# Patient Record
Sex: Male | Born: 1950 | Race: White | Hispanic: No | Marital: Single | Smoking: Never smoker
Health system: Southern US, Community
[De-identification: ages and names within clinical notes are randomized; demographics above are authoritative.]

## PROBLEM LIST (undated history)

## (undated) DIAGNOSIS — R5381 Other malaise: Secondary | ICD-10-CM

## (undated) DIAGNOSIS — R5383 Other fatigue: Secondary | ICD-10-CM

## (undated) DIAGNOSIS — G4733 Obstructive sleep apnea (adult) (pediatric): Secondary | ICD-10-CM

## (undated) DIAGNOSIS — Z9989 Dependence on other enabling machines and devices: Secondary | ICD-10-CM

## (undated) DIAGNOSIS — I4891 Unspecified atrial fibrillation: Secondary | ICD-10-CM

## (undated) DIAGNOSIS — R600 Localized edema: Secondary | ICD-10-CM

## (undated) DIAGNOSIS — K352 Acute appendicitis with generalized peritonitis, without abscess: Secondary | ICD-10-CM

## (undated) DIAGNOSIS — M7542 Impingement syndrome of left shoulder: Secondary | ICD-10-CM

## (undated) DIAGNOSIS — I10 Essential (primary) hypertension: Secondary | ICD-10-CM

## (undated) DIAGNOSIS — A6000 Herpesviral infection of urogenital system, unspecified: Secondary | ICD-10-CM

## (undated) DIAGNOSIS — E782 Mixed hyperlipidemia: Secondary | ICD-10-CM

## (undated) DIAGNOSIS — K3580 Unspecified acute appendicitis: Secondary | ICD-10-CM

## (undated) DIAGNOSIS — N400 Enlarged prostate without lower urinary tract symptoms: Secondary | ICD-10-CM

## (undated) DIAGNOSIS — E781 Pure hyperglyceridemia: Secondary | ICD-10-CM

## (undated) DIAGNOSIS — R7303 Prediabetes: Secondary | ICD-10-CM

## (undated) DIAGNOSIS — J301 Allergic rhinitis due to pollen: Secondary | ICD-10-CM

## (undated) HISTORY — PX: HERNIA REPAIR: SHX51

## (undated) HISTORY — DX: Impingement syndrome of left shoulder: M75.42

## (undated) HISTORY — DX: Prediabetes: R73.03

## (undated) HISTORY — DX: Other malaise: R53.81

## (undated) HISTORY — DX: Unspecified atrial fibrillation: I48.91

## (undated) HISTORY — DX: Mixed hyperlipidemia: E78.2

## (undated) HISTORY — DX: Unspecified acute appendicitis: K35.80

## (undated) HISTORY — DX: Essential (primary) hypertension: I10

## (undated) HISTORY — DX: Pure hyperglyceridemia: E78.1

## (undated) HISTORY — DX: Herpesviral infection of urogenital system, unspecified: A60.00

## (undated) HISTORY — DX: Other fatigue: R53.83

## (undated) HISTORY — DX: Acute appendicitis with generalized peritonitis, without abscess: K35.20

## (undated) HISTORY — DX: Obstructive sleep apnea (adult) (pediatric): G47.33

## (undated) HISTORY — DX: Benign prostatic hyperplasia without lower urinary tract symptoms: N40.0

## (undated) HISTORY — DX: Allergic rhinitis due to pollen: J30.1

## (undated) HISTORY — DX: Dependence on other enabling machines and devices: Z99.89

## (undated) HISTORY — DX: Localized edema: R60.0

---

## 2012-04-09 ENCOUNTER — Ambulatory Visit: Payer: Self-pay | Admitting: Gastroenterology

## 2012-04-09 LAB — PROTIME-INR: INR: 1

## 2012-04-09 LAB — CBC
HCT: 45.5 % (ref 40.0–52.0)
MCH: 31.9 pg (ref 26.0–34.0)
MCV: 90 fL (ref 80–100)
RBC: 5.04 10*6/uL (ref 4.40–5.90)
RDW: 13.3 % (ref 11.5–14.5)
WBC: 11.7 10*3/uL — ABNORMAL HIGH (ref 3.8–10.6)

## 2012-04-09 LAB — BASIC METABOLIC PANEL
Calcium, Total: 9.2 mg/dL (ref 8.5–10.1)
Chloride: 104 mmol/L (ref 98–107)
Co2: 27 mmol/L (ref 21–32)
Creatinine: 1.1 mg/dL (ref 0.60–1.30)
EGFR (African American): >60
Potassium: 3.6 mmol/L (ref 3.5–5.1)
Sodium: 139 mmol/L (ref 136–145)

## 2012-04-12 LAB — PATHOLOGY REPORT

## 2013-11-09 DIAGNOSIS — R5383 Other fatigue: Secondary | ICD-10-CM

## 2013-11-09 DIAGNOSIS — E782 Mixed hyperlipidemia: Secondary | ICD-10-CM | POA: Insufficient documentation

## 2013-11-09 DIAGNOSIS — R5381 Other malaise: Secondary | ICD-10-CM

## 2013-11-09 DIAGNOSIS — I10 Essential (primary) hypertension: Secondary | ICD-10-CM | POA: Insufficient documentation

## 2013-11-09 DIAGNOSIS — G4733 Obstructive sleep apnea (adult) (pediatric): Secondary | ICD-10-CM

## 2013-11-09 DIAGNOSIS — I4891 Unspecified atrial fibrillation: Secondary | ICD-10-CM

## 2013-11-09 DIAGNOSIS — A6000 Herpesviral infection of urogenital system, unspecified: Secondary | ICD-10-CM | POA: Insufficient documentation

## 2013-11-09 DIAGNOSIS — Z9989 Dependence on other enabling machines and devices: Secondary | ICD-10-CM

## 2013-11-09 HISTORY — DX: Herpesviral infection of urogenital system, unspecified: A60.00

## 2013-11-09 HISTORY — DX: Unspecified atrial fibrillation: I48.91

## 2013-11-09 HISTORY — DX: Dependence on other enabling machines and devices: Z99.89

## 2013-11-09 HISTORY — DX: Other fatigue: R53.83

## 2013-11-09 HISTORY — DX: Obstructive sleep apnea (adult) (pediatric): G47.33

## 2013-11-09 HISTORY — DX: Essential (primary) hypertension: I10

## 2013-11-09 HISTORY — DX: Other malaise: R53.81

## 2013-11-09 HISTORY — DX: Mixed hyperlipidemia: E78.2

## 2014-01-27 DIAGNOSIS — N400 Enlarged prostate without lower urinary tract symptoms: Secondary | ICD-10-CM | POA: Insufficient documentation

## 2014-01-27 DIAGNOSIS — E781 Pure hyperglyceridemia: Secondary | ICD-10-CM

## 2014-01-27 HISTORY — DX: Benign prostatic hyperplasia without lower urinary tract symptoms: N40.0

## 2014-01-27 HISTORY — DX: Pure hyperglyceridemia: E78.1

## 2014-10-29 NOTE — Consult Note (Signed)
PATIENT NAME:  Jonathan Chung, VERGA MR#:  [id number] DATE OF BIRTH:  1950/09/06  DATE OF CONSULTATION:  04/09/2012  REFERRING PHYSICIAN:  Liana Gerold, MD  CONSULTING PHYSICIAN:  Lollie Sails, MD  REASON FOR CONSULTATION: Esophageal foreign body/food impaction.   HISTORY OF PRESENT ILLNESS: Jonathan Chung is a very pleasant 64 year old Caucasian male who came to the Emergency Room last night with problem of difficulty swallowing. He states that he was eating some chicken tenders around noon. One of these seemed to get stuck. He said he threw up several times, not induced but rather on its own, with no decrease in the symptoms. Since that time he has not been handling secretions. Multiple interventions have been undertaken in the Emergency Room without success. The patient states that he has had some occasional food hanging that may last for 5 or 10 seconds for about a year intermittently. He has never had something hung to this degree. He has never had an upper scope in the past. He apparently, however, in discussion with him though has had two colonoscopies. On the last one a little over a year or so ago his physician also wanted to do an upper scope but for some reason that was not done. I believe he was having significant symptoms at that time. He has no history of colon polyps. He does have a history of a hiatal hernia with occasional reflux and he takes Mylanta occasionally. He has tried proton pump inhibitor for a short period but I do not believe he has ever taken it regularly. He does take a 325 mg aspirin daily. He has no history of peptic ulcer disease.   GI FAMILY HISTORY: Maternal aunt with colon cancer. Negative for liver disease. Father with peptic ulcer disease.   PAST MEDICAL HISTORY:  1. History of atrial fibrillation. 2. Hypertension.  3. He denies any hospitalizations or surgeries.  4. History of herpes for which he takes a suppressive dose of Valacyclovir.   ALLERGIES: He has  allergy to Cipro.   CURRENT MEDICATIONS:  1. Aspirin 325 mg. 2. Hydrochlorothiazide 25 mg a day. 3. Levothyroxine 50 mcg a day. 4. Metoprolol 5 mg twice a day. 5. Simvastatin 20 mg once a day.  6. Valacyclovir 500 mg once a day.   PHYSICAL EXAMINATION:   VITAL SIGNS: Temperature 97.7, pulse 98, respirations 20, blood pressure 119/87, pulse oximetry 95%.   GENERAL: He is a well appearing 64 year old Caucasian male in no acute distress.   HEENT: Normocephalic, atraumatic.   EYES: Anicteric.   NOSE: Septum midline.   OROPHARYNX: No lesions. Good dentition.   NECK: Supple. No JVD. No lymphadenopathy. No thyromegaly.   HEART: Irregular rate and rhythm.   LUNGS: Bilaterally clear.   ABDOMEN: Soft, nontender, nondistended. Bowel sounds positive, normoactive.   RECTAL: Anorectal exam is deferred.   EXTREMITIES: No clubbing, cyanosis, or edema.   NEUROLOGICAL: Cranial nerves II through XII grossly intact. Muscle strength bilaterally equal and symmetric. Deep tendon reflexes bilaterally equal and symmetric.   LABORATORY, DIAGNOSTIC, AND RADIOLOGICAL DATA: Glucose 109, BUN 22, creatinine 1.10, sodium 139, potassium 3.6, chloride 104, bicarb 27, osmolality 281, calcium 9.2. Hemogram shows a white count of 11.7, hemoglobin and hematocrit 16.1/45.5, platelet count 211, MCV 90. Pro-time 13.3. INR 1.0. There have been no imaging studies.   ASSESSMENT:  1. Esophageal food impaction.  2. Previous history of intermittent dysphagia, not on a proton pump inhibitor.  3. Atrial fibrillation as noted above.   RECOMMENDATION: EGD with foreign  body removal with anesthesia assistance. I have discussed the risks, benefits, and complications of EGD to include, but not limited to, bleeding, infection, perforation, and the risk of sedation and he wishes to proceed.   ____________________________ Lollie Sails, MD mus:drc D: 04/09/2012 10:09:34 ET T: 04/09/2012 10:31:56  ET JOB#: [id number]  cc: Lollie Sails, MD, <Dictator> Lollie Sails MD ELECTRONICALLY SIGNED 05/11/2012 11:19

## 2014-10-29 NOTE — Consult Note (Signed)
Brief Consult Note: Diagnosis: esophageal foriegn body/food impaction.   Patient was seen by consultant.   Consult note dictated.   Recommend to proceed with surgery or procedure.   Discussed with Attending MD.   Comments: Patietn seen and examined, chart reviewed. Please see full GI consult [phone].  Patient comes to ER with esophageal food impaction.  Will arrange egd with anesthesia assistance.  Electronic Signatures: Loistine Simas (MD)  (Signed 29-Sep-13 10:26)  Authored: Brief Consult Note   Last Updated: 29-Sep-13 10:26 by Loistine Simas (MD)

## 2014-11-18 DIAGNOSIS — R7303 Prediabetes: Secondary | ICD-10-CM

## 2014-11-18 HISTORY — DX: Prediabetes: R73.03

## 2014-11-25 DIAGNOSIS — D239 Other benign neoplasm of skin, unspecified: Secondary | ICD-10-CM | POA: Insufficient documentation

## 2015-08-15 DIAGNOSIS — J301 Allergic rhinitis due to pollen: Secondary | ICD-10-CM | POA: Insufficient documentation

## 2015-08-15 HISTORY — DX: Allergic rhinitis due to pollen: J30.1

## 2016-08-16 DIAGNOSIS — M7542 Impingement syndrome of left shoulder: Secondary | ICD-10-CM | POA: Insufficient documentation

## 2016-08-16 DIAGNOSIS — R600 Localized edema: Secondary | ICD-10-CM | POA: Insufficient documentation

## 2016-08-16 HISTORY — DX: Localized edema: R60.0

## 2016-08-16 HISTORY — DX: Impingement syndrome of left shoulder: M75.42

## 2016-09-06 ENCOUNTER — Emergency Department: Payer: Medicare HMO | Admitting: Anesthesiology

## 2016-09-06 ENCOUNTER — Emergency Department: Payer: Medicare HMO

## 2016-09-06 ENCOUNTER — Inpatient Hospital Stay
Admission: EM | Admit: 2016-09-06 | Discharge: 2016-09-09 | DRG: 340 | Disposition: A | Discharge status: Home Health | Payer: Medicare HMO | Attending: Surgery | Admitting: Surgery

## 2016-09-06 ENCOUNTER — Encounter
Admission: EM | Disposition: A | Discharge status: Home Health | Payer: Self-pay | Source: Home / Self Care | Attending: Surgery

## 2016-09-06 DIAGNOSIS — Z7982 Long term (current) use of aspirin: Secondary | ICD-10-CM

## 2016-09-06 DIAGNOSIS — I10 Essential (primary) hypertension: Secondary | ICD-10-CM | POA: Diagnosis present

## 2016-09-06 DIAGNOSIS — Z6833 Body mass index (BMI) 33.0-33.9, adult: Secondary | ICD-10-CM

## 2016-09-06 DIAGNOSIS — K3580 Unspecified acute appendicitis: Secondary | ICD-10-CM | POA: Diagnosis present

## 2016-09-06 DIAGNOSIS — Z79899 Other long term (current) drug therapy: Secondary | ICD-10-CM

## 2016-09-06 DIAGNOSIS — E669 Obesity, unspecified: Secondary | ICD-10-CM | POA: Diagnosis present

## 2016-09-06 DIAGNOSIS — K352 Acute appendicitis with generalized peritonitis, without abscess: Secondary | ICD-10-CM

## 2016-09-06 DIAGNOSIS — I4891 Unspecified atrial fibrillation: Secondary | ICD-10-CM | POA: Diagnosis present

## 2016-09-06 DIAGNOSIS — G4730 Sleep apnea, unspecified: Secondary | ICD-10-CM | POA: Diagnosis present

## 2016-09-06 DIAGNOSIS — Z888 Allergy status to other drugs, medicaments and biological substances status: Secondary | ICD-10-CM

## 2016-09-06 HISTORY — PX: LAPAROSCOPIC APPENDECTOMY: SHX408

## 2016-09-06 HISTORY — DX: Unspecified atrial fibrillation: I48.91

## 2016-09-06 HISTORY — DX: Essential (primary) hypertension: I10

## 2016-09-06 LAB — CBC
HEMATOCRIT: 43.2 % (ref 40.0–52.0)
HEMOGLOBIN: 14.6 g/dL (ref 13.0–18.0)
MCH: 31.1 pg (ref 26.0–34.0)
MCHC: 33.7 g/dL (ref 32.0–36.0)
MCV: 92.2 fL (ref 80.0–100.0)
Platelets: 178 10*3/uL (ref 150–440)
RBC: 4.69 MIL/uL (ref 4.40–5.90)
RDW: 13.6 % (ref 11.5–14.5)
WBC: 11.5 10*3/uL — ABNORMAL HIGH (ref 3.8–10.6)

## 2016-09-06 LAB — URINALYSIS, COMPLETE (UACMP) WITH MICROSCOPIC
BACTERIA UA: NONE SEEN
Bilirubin Urine: NEGATIVE
Glucose, UA: NEGATIVE mg/dL
Hgb urine dipstick: NEGATIVE
Ketones, ur: 5 mg/dL — AB
Leukocytes, UA: NEGATIVE
Nitrite: NEGATIVE
PROTEIN: NEGATIVE mg/dL
RBC / HPF: NONE SEEN RBC/hpf (ref 0–5)
SPECIFIC GRAVITY, URINE: 1.017 (ref 1.005–1.030)
SQUAMOUS EPITHELIAL / LPF: NONE SEEN
pH: 5 (ref 5.0–8.0)

## 2016-09-06 LAB — COMPREHENSIVE METABOLIC PANEL
ALBUMIN: 4.2 g/dL (ref 3.5–5.0)
ALK PHOS: 58 U/L (ref 38–126)
ALT: 19 U/L (ref 17–63)
ANION GAP: 8 (ref 5–15)
AST: 19 U/L (ref 15–41)
BILIRUBIN TOTAL: 0.8 mg/dL (ref 0.3–1.2)
BUN: 19 mg/dL (ref 6–20)
CO2: 25 mmol/L (ref 22–32)
Calcium: 9.1 mg/dL (ref 8.9–10.3)
Chloride: 105 mmol/L (ref 101–111)
Creatinine, Ser: 1.02 mg/dL (ref 0.61–1.24)
GFR calc Af Amer: >60 mL/min (ref >60)
GFR calc non Af Amer: >60 mL/min (ref >60)
GLUCOSE: 112 mg/dL — AB (ref 65–99)
POTASSIUM: 4 mmol/L (ref 3.5–5.1)
SODIUM: 138 mmol/L (ref 135–145)
TOTAL PROTEIN: 7.5 g/dL (ref 6.5–8.1)

## 2016-09-06 LAB — LIPASE, BLOOD: Lipase: 24 U/L (ref 11–51)

## 2016-09-06 SURGERY — APPENDECTOMY, LAPAROSCOPIC
Anesthesia: General

## 2016-09-06 MED ORDER — PROPOFOL 10 MG/ML IV BOLUS
INTRAVENOUS | Status: AC
Start: 2016-09-06 — End: 2016-09-06
  Filled 2016-09-06: qty 20

## 2016-09-06 MED ORDER — FENTANYL CITRATE (PF) 100 MCG/2ML IJ SOLN
INTRAMUSCULAR | Status: AC
  Filled 2016-09-06: qty 2

## 2016-09-06 MED ORDER — ONDANSETRON HCL 4 MG/2ML IJ SOLN
INTRAMUSCULAR | Status: AC
  Filled 2016-09-06: qty 2

## 2016-09-06 MED ORDER — PROPOFOL 10 MG/ML IV BOLUS
INTRAVENOUS | Status: DC | PRN
  Administered 2016-09-06: 160 mg via INTRAVENOUS

## 2016-09-06 MED ORDER — LIDOCAINE HCL (PF) 2 % IJ SOLN
INTRAMUSCULAR | Status: AC
  Filled 2016-09-06: qty 2

## 2016-09-06 MED ORDER — SUCCINYLCHOLINE CHLORIDE 20 MG/ML IJ SOLN
INTRAMUSCULAR | Status: AC
  Filled 2016-09-06: qty 1

## 2016-09-06 MED ORDER — LACTATED RINGERS IV SOLN
INTRAVENOUS | Status: DC | PRN
  Administered 2016-09-06 – 2016-09-07 (×2): via INTRAVENOUS

## 2016-09-06 MED ORDER — SUCCINYLCHOLINE CHLORIDE 20 MG/ML IJ SOLN
INTRAMUSCULAR | Status: DC | PRN
  Administered 2016-09-06: 100 mg via INTRAVENOUS

## 2016-09-06 MED ORDER — IOPAMIDOL (ISOVUE-300) INJECTION 61%
100.0000 mL | Freq: Once | INTRAVENOUS | Status: AC | PRN
  Administered 2016-09-06: 100 mL via INTRAVENOUS

## 2016-09-06 MED ORDER — SODIUM CHLORIDE 0.9 % IV BOLUS (SEPSIS)
1000.0000 mL | INTRAVENOUS | Status: AC
  Administered 2016-09-06: 1000 mL via INTRAVENOUS

## 2016-09-06 MED ORDER — MIDAZOLAM HCL 2 MG/2ML IJ SOLN
INTRAMUSCULAR | Status: DC | PRN
  Administered 2016-09-06: 2 mg via INTRAVENOUS

## 2016-09-06 MED ORDER — ESMOLOL HCL 100 MG/10ML IV SOLN
INTRAVENOUS | Status: AC
  Filled 2016-09-06: qty 10

## 2016-09-06 MED ORDER — FENTANYL CITRATE (PF) 100 MCG/2ML IJ SOLN
INTRAMUSCULAR | Status: DC | PRN
  Administered 2016-09-06 (×4): 50 ug via INTRAVENOUS

## 2016-09-06 MED ORDER — LIDOCAINE HCL (CARDIAC) 20 MG/ML IV SOLN
INTRAVENOUS | Status: DC | PRN
  Administered 2016-09-06: 100 mg via INTRAVENOUS

## 2016-09-06 MED ORDER — MIDAZOLAM HCL 2 MG/2ML IJ SOLN
INTRAMUSCULAR | Status: AC
  Filled 2016-09-06: qty 2

## 2016-09-06 MED ORDER — MORPHINE SULFATE (PF) 4 MG/ML IV SOLN
4.0000 mg | Freq: Once | INTRAVENOUS | Status: AC
  Administered 2016-09-06: 4 mg via INTRAVENOUS
  Filled 2016-09-06: qty 1

## 2016-09-06 MED ORDER — MORPHINE SULFATE (PF) 2 MG/ML IV SOLN
2.0000 mg | INTRAVENOUS | Status: DC | PRN
  Administered 2016-09-06: 2 mg via INTRAVENOUS
  Filled 2016-09-06: qty 1

## 2016-09-06 MED ORDER — ACETAMINOPHEN 10 MG/ML IV SOLN
INTRAVENOUS | Status: AC
  Filled 2016-09-06: qty 100

## 2016-09-06 MED ORDER — IOPAMIDOL (ISOVUE-300) INJECTION 61%
30.0000 mL | Freq: Once | INTRAVENOUS | Status: AC | PRN
  Administered 2016-09-06: 30 mL via ORAL

## 2016-09-06 MED ORDER — PIPERACILLIN-TAZOBACTAM 3.375 G IVPB
3.3750 g | Freq: Three times a day (TID) | INTRAVENOUS | Status: DC
  Administered 2016-09-06: 3.375 g via INTRAVENOUS
  Filled 2016-09-06 (×4): qty 50

## 2016-09-06 MED ORDER — ONDANSETRON HCL 4 MG/2ML IJ SOLN
4.0000 mg | INTRAMUSCULAR | Status: AC
  Administered 2016-09-06: 4 mg via INTRAVENOUS
  Filled 2016-09-06: qty 2

## 2016-09-06 SURGICAL SUPPLY — 43 items
APPLICATOR COTTON TIP 6IN STRL (MISCELLANEOUS) ×3 IMPLANT
APPLIER CLIP 5 13 M/L LIGAMAX5 (MISCELLANEOUS)
BLADE CLIPPER SURG (BLADE) ×3 IMPLANT
CANISTER SUCT 1200ML W/VALVE (MISCELLANEOUS) IMPLANT
CANISTER SUCT 3000ML (MISCELLANEOUS) ×3 IMPLANT
CHLORAPREP W/TINT 26ML (MISCELLANEOUS) ×3 IMPLANT
CLIP APPLIE 5 13 M/L LIGAMAX5 (MISCELLANEOUS) IMPLANT
CUTTER FLEX LINEAR 45M (STAPLE) ×3 IMPLANT
DERMABOND ADVANCED (GAUZE/BANDAGES/DRESSINGS) ×2
DERMABOND ADVANCED .7 DNX12 (GAUZE/BANDAGES/DRESSINGS) ×1 IMPLANT
DRAIN CHANNEL JP 19F (MISCELLANEOUS) ×3 IMPLANT
ELECT CAUTERY BLADE 6.4 (BLADE) ×3 IMPLANT
ELECT REM PT RETURN 9FT ADLT (ELECTROSURGICAL) ×3
ELECTRODE REM PT RTRN 9FT ADLT (ELECTROSURGICAL) ×1 IMPLANT
ENDOPOUCH RETRIEVER 10 (MISCELLANEOUS) ×3 IMPLANT
GAUZE SPONGE 4X4 12PLY STRL (GAUZE/BANDAGES/DRESSINGS) ×6 IMPLANT
GLOVE BIO SURGEON STRL SZ7 (GLOVE) ×6 IMPLANT
GOWN STRL REUS W/ TWL LRG LVL3 (GOWN DISPOSABLE) ×2 IMPLANT
GOWN STRL REUS W/TWL LRG LVL3 (GOWN DISPOSABLE) ×4
IRRIGATION STRYKERFLOW (MISCELLANEOUS) ×1 IMPLANT
IRRIGATOR STRYKERFLOW (MISCELLANEOUS) ×3
IV NS 1000ML (IV SOLUTION) ×4
IV NS 1000ML BAXH (IV SOLUTION) ×2 IMPLANT
NEEDLE HYPO 22GX1.5 SAFETY (NEEDLE) ×3 IMPLANT
NS IRRIG 500ML POUR BTL (IV SOLUTION) ×3 IMPLANT
PACK LAP CHOLECYSTECTOMY (MISCELLANEOUS) ×3 IMPLANT
PENCIL ELECTRO HAND CTR (MISCELLANEOUS) ×3 IMPLANT
RELOAD 45 VASCULAR/THIN (ENDOMECHANICALS) IMPLANT
RELOAD STAPLE TA45 3.5 REG BLU (ENDOMECHANICALS) ×6 IMPLANT
SCALPEL HARMONIC ACE (MISCELLANEOUS) ×3 IMPLANT
SCISSORS METZENBAUM CVD 33 (INSTRUMENTS) IMPLANT
SLEEVE ENDOPATH XCEL 5M (ENDOMECHANICALS) ×3 IMPLANT
SLEEVE SCD COMPRESS THIGH MED (MISCELLANEOUS) ×3 IMPLANT
SPONGE DRAIN TRACH 4X4 STRL 2S (GAUZE/BANDAGES/DRESSINGS) ×3 IMPLANT
SPONGE LAP 18X18 5 PK (GAUZE/BANDAGES/DRESSINGS) ×3 IMPLANT
SUT ETHILON 3 0 PS 1 (SUTURE) ×3 IMPLANT
SUT MNCRL AB 4-0 PS2 18 (SUTURE) ×3 IMPLANT
SUT VICRYL 0 AB UR-6 (SUTURE) ×6 IMPLANT
SYR 20CC LL (SYRINGE) ×3 IMPLANT
TRAY FOLEY W/METER SILVER 16FR (SET/KITS/TRAYS/PACK) IMPLANT
TROCAR XCEL BLUNT TIP 100MML (ENDOMECHANICALS) ×3 IMPLANT
TROCAR XCEL NON-BLD 5MMX100MML (ENDOMECHANICALS) ×6 IMPLANT
TUBING INSUF HEATED (TUBING) ×3 IMPLANT

## 2016-09-06 NOTE — ED Notes (Signed)
CT informed that patient had finished one bottle of contrast and could be scanned per Dr. Karma Greaser

## 2016-09-06 NOTE — H&P (Signed)
Patient ID: Jonathan Chung, male   DOB: September 21, 1950, 66 y.o.   MRN: [id number]  HPI Jonathan Chung is a 66 y.o. male that 2 days history of abdominal pain that is started Saturday night. He reports that the pain is intermittent but today has been constant. Pain is moderate to severe intensity located in the right lower quadrant and radiated to the mid abdomen. The associated nausea no vomiting. No fevers no chills. Decreased appetite. He does have a history of A. fib and is only taking aspirin and Cardizem. Part of the workup a CT scan was performed and I have personally reviewed there is evidence of appendiceal wall thickening with some fat stranding and dilation of the appendix consistent with appendicitis. There is no evidence of free air and there is no evidence of abscess or perforation. History of umbilical hernia repair a few years ago with mesh. Mildly elevated WBC, normal creatinine. HE has good CV reserve, able to perform more than 6 METS w/o angina or SOB.   HPI  Past Medical History:  Diagnosis Date  . A-fib ([place])   . Hypertension     Past Surgical History:  Procedure Laterality Date  . HERNIA REPAIR      No family history on file.  Social History Social History  Substance Use Topics  . Smoking status: Never Smoker  . Smokeless tobacco: Never Used  . Alcohol use Yes    Allergies  Allergen Reactions  . Ciprofloxacin Rash    Current Facility-Administered Medications  Medication Dose Route Frequency Provider Last Rate Last Dose  . morphine 2 MG/ML injection 2 mg  2 mg Intravenous Q2H PRN Marcos Peloso F Aashna Matson, MD      . piperacillin-tazobactam (ZOSYN) IVPB 3.375 g  3.375 g Intravenous Q8H Owen Pratte Sarita Haver, MD       Current Outpatient Prescriptions  Medication Sig Dispense Refill  . aspirin 325 MG tablet Take 325 mg by mouth every morning.    . cetirizine (ZYRTEC) 10 MG tablet Take 10 mg by mouth daily.    Marland Kitchen diltiazem (CARDIZEM CD) 180 MG 24 hr capsule Take 180 mg by mouth  daily.    . hydrochlorothiazide (HYDRODIURIL) 25 MG tablet Take 12.5 mg by mouth daily.    Marland Kitchen levothyroxine (SYNTHROID, LEVOTHROID) 50 MCG tablet Take 50 mcg by mouth daily.    Marland Kitchen loratadine (CLARITIN) 10 MG tablet Take 10 mg by mouth daily.    . Multiple Vitamin (THERA) TABS Take 1 tablet by mouth every morning.    . Omega-3 1000 MG CAPS Take 1,000 mg by mouth every morning.    Marland Kitchen omeprazole (PRILOSEC) 20 MG capsule Take 20 mg by mouth daily.    . simvastatin (ZOCOR) 20 MG tablet Take 20 mg by mouth daily.  0  . tamsulosin (FLOMAX) 0.4 MG CAPS capsule Take 0.4 mg by mouth daily.  0  . valACYclovir (VALTREX) 500 MG tablet Take 500 mg by mouth 4 (four) times a week. Take 500 mg on Sunday, Tuesday, Thursday and Saturday.    . vitamin E 400 UNIT capsule Take 400 Units by mouth every morning.       Review of Systems A 10 point review of systems was asked and was negative except for the information on the HPI  Physical Exam Blood pressure 112/84, pulse (!) 115, temperature 99 F (37.2 C), temperature source Oral, resp. rate (!) 28, height 5\' 10"  (1.778 m), weight 99.8 kg (220 lb), SpO2 91 %. CONSTITUTIONAL: NAD EYES: Pupils are  equal, round, and reactive to light, Sclera are non-icteric. EARS, NOSE, MOUTH AND THROAT: The oropharynx is clear. The oral mucosa is pink and moist. Hearing is intact to voice. LYMPH NODES:  Lymph nodes in the neck are normal. RESPIRATORY:  Lungs are clear. There is normal respiratory effort, with equal breath sounds bilaterally, and without pathologic use of accessory muscles. CARDIOVASCULAR: Heart is iregular without murmurs, gallops, or rubs. Tachy GI: The abdomen is  Soft, Previous infraumbilical scar. There is tenderness to palpation on RLQ . No overt peritonitis GU: Rectal deferred.   MUSCULOSKELETAL: Normal muscle strength and tone. No cyanosis or edema.   SKIN: Turgor is good and there are no pathologic skin lesions or ulcers. NEUROLOGIC: Motor and sensation is  grossly normal. Cranial nerves are grossly intact. PSYCH:  Oriented to person, place and time. Affect is normal.  Data Reviewed I have personally reviewed the patient's imaging, laboratory findings and medical records.    Assessment/Plan   Male with classic signs and symptoms of acute appendicitis confirmed by CT scan. Discussed with the patient in detail about his disease process and I do recommend appendectomy. Options of antibiotic management versus surgery were discussed. He agrees to proceed with appendectomy. The risks, benefits, complications, treatment options, and expected outcomes were discussed with the patient. The treatment of antibiotics alone was discussed giving a 20% chance that this could fail and surgery would be necessary.  Also discussed continuing to the operating room for Laparoscopic Appendectomy.  The possibilities of  bleeding, recurrent infection, perforation of viscus, finding a normal appendix, the need for additional procedures, failure to diagnose a condition, conversion to open procedure and creating a complication requiring transfusion or further operations were discussed. The patient was given the opportunity to ask questions and have them answered.  Patient would like to proceed with Laparoscopic Appendectomy and consent was obtained.   Caroleen Hamman, MD FACS General Surgeon 09/06/2016, 8:16 PM

## 2016-09-06 NOTE — Anesthesia Preprocedure Evaluation (Addendum)
Anesthesia Evaluation  Patient identified by MRN, date of birth, ID band Patient awake    Reviewed: Allergy & Precautions, NPO status , Patient's Chart, lab work & pertinent test results  History of Anesthesia Complications Negative for: history of anesthetic complications  Airway Mallampati: II  TM Distance: >3 FB Neck ROM: Full    Dental  (+) Partial Upper   Pulmonary neg pulmonary ROS, sleep apnea (does not use CPAP) , neg COPD,    breath sounds clear to auscultation- rhonchi (-) wheezing      Cardiovascular hypertension, Pt. on medications + dysrhythmias Atrial Fibrillation  Rhythm:Regular Rate:Normal - Systolic murmurs and - Diastolic murmurs    Neuro/Psych negative neurological ROS  negative psych ROS   GI/Hepatic negative GI ROS, Neg liver ROS,   Endo/Other  neg diabetesHypothyroidism   Renal/GU negative Renal ROS     Musculoskeletal negative musculoskeletal ROS (+)   Abdominal (+) + obese,   Peds  Hematology negative hematology ROS (+)   Anesthesia Other Findings Past Medical History: No date: A-fib ([facility]) No date: Hypertension   Reproductive/Obstetrics                             Anesthesia Physical Anesthesia Plan  ASA: III and emergent  Anesthesia Plan: General   Post-op Pain Management:    Induction: Intravenous, Rapid sequence and Cricoid pressure planned  Airway Management Planned: Oral ETT  Additional Equipment:   Intra-op Plan:   Post-operative Plan: Extubation in OR  Informed Consent: I have reviewed the patients History and Physical, chart, labs and discussed the procedure including the risks, benefits and alternatives for the proposed anesthesia with the patient or authorized representative who has indicated his/her understanding and acceptance.   Dental advisory given  Plan Discussed with: CRNA and Anesthesiologist  Anesthesia Plan Comments:         Anesthesia Quick Evaluation

## 2016-09-06 NOTE — ED Triage Notes (Signed)
Pt c/o lower abd pain with nausea since Saturday night. Denies vomiting or diarrhea. States had a BM today after taking a stool softener

## 2016-09-06 NOTE — ED Provider Notes (Signed)
[facility] Emergency Department Provider Note  ____________________________________________   First MD Initiated Contact with Patient 09/06/16 1640     (approximate)  I have reviewed the triage vital signs and the nursing notes.   HISTORY  Chief Complaint Abdominal Pain    HPI Jonathan Chung is a 66 y.o. male with a history of A. fib and hypertension but who only takes a full dose aspirin daily and is not on any other anticoagulation.  He presents for evaluation of a Almyra Free worsening lower abdominal pain with nausea that has been getting worse over the last 3 days.  He states it started and then seemed to get a little bit better but then came back and is now severe.  He had not vomited until coming to the emergency department but he did have persistent vomiting and anorexia.  He denies fever/chills, chest pain, shortness of breath, diarrhea, dysuria.  Movement makes the pain worse and rest makes it a little bit better.  He describes the pain as sharp, aching, and primarily in his lower abdomen across both sides although it radiates to his right flank.  He has no history of kidney stones.   Past Medical History:  Diagnosis Date  . A-fib ([place])   . Hypertension     There are no active problems to display for this patient.   Past Surgical History:  Procedure Laterality Date  . HERNIA REPAIR      Prior to Admission medications   Medication Sig Start Date End Date Taking? Authorizing Provider  aspirin 325 MG tablet Take 325 mg by mouth every morning.   Yes Historical Provider, MD  cetirizine (ZYRTEC) 10 MG tablet Take 10 mg by mouth daily.   Yes Historical Provider, MD  diltiazem (CARDIZEM CD) 180 MG 24 hr capsule Take 180 mg by mouth daily. 09/06/16  Yes Historical Provider, MD  hydrochlorothiazide (HYDRODIURIL) 25 MG tablet Take 12.5 mg by mouth daily. 12/22/15  Yes Historical Provider, MD  levothyroxine (SYNTHROID, LEVOTHROID) 50 MCG tablet Take 50 mcg  by mouth daily. 09/06/16  Yes Historical Provider, MD  loratadine (CLARITIN) 10 MG tablet Take 10 mg by mouth daily.   Yes Historical Provider, MD  Multiple Vitamin (THERA) TABS Take 1 tablet by mouth every morning.   Yes Historical Provider, MD  Omega-3 1000 MG CAPS Take 1,000 mg by mouth every morning.   Yes Historical Provider, MD  omeprazole (PRILOSEC) 20 MG capsule Take 20 mg by mouth daily. 12/15/15  Yes Historical Provider, MD  simvastatin (ZOCOR) 20 MG tablet Take 20 mg by mouth daily. 06/23/16  Yes Historical Provider, MD  tamsulosin (FLOMAX) 0.4 MG CAPS capsule Take 0.4 mg by mouth daily. 08/09/16  Yes Historical Provider, MD  valACYclovir (VALTREX) 500 MG tablet Take 500 mg by mouth 4 (four) times a week. Take 500 mg on Sunday, Tuesday, Thursday and Saturday. 11/21/15  Yes Historical Provider, MD  vitamin E 400 UNIT capsule Take 400 Units by mouth every morning.   Yes Historical Provider, MD    Allergies Ciprofloxacin  No family history on file.  Social History Social History  Substance Use Topics  . Smoking status: Never Smoker  . Smokeless tobacco: Never Used  . Alcohol use Yes    Review of Systems Constitutional: No fever/chills Eyes: No visual changes. ENT: No sore throat. Cardiovascular: Denies chest pain. Respiratory: Denies shortness of breath. Gastrointestinal: Lower abdominal pain (bilateral) that radiates to right flank.  Nausea x 3 days with vomiting  after arriving in ED.  No diarrhea.  No constipation. Genitourinary: Negative for dysuria. Musculoskeletal: Negative for back pain. Skin: Negative for rash. Neurological: Negative for headaches, focal weakness or numbness.  10-point ROS otherwise negative.  ____________________________________________   PHYSICAL EXAM:  VITAL SIGNS: ED Triage Vitals  Enc Vitals Group     BP 09/06/16 1606 (!) 142/75     Pulse Rate 09/06/16 1606 100     Resp 09/06/16 1606 16     Temp 09/06/16 1606 99 F (37.2 C)     Temp  Source 09/06/16 1606 Oral     SpO2 09/06/16 1606 97 %     Weight 09/06/16 1607 220 lb (99.8 kg)     Height 09/06/16 1607 5\' 10"  (1.778 m)     Head Circumference --      Peak Flow --      Pain Score 09/06/16 1610 10     Pain Loc --      Pain Edu? --      Excl. in [place]? --     Constitutional: Alert and oriented. Appears uncomfortable but non-toxic Eyes: Conjunctivae are normal. PERRL. EOMI. Head: Atraumatic. Nose: No congestion/rhinnorhea. Mouth/Throat: Mucous membranes are moist. Neck: No stridor.  No meningeal signs.   Cardiovascular: Normal rate, regular rhythm. Good peripheral circulation. Grossly normal heart sounds. Respiratory: Normal respiratory effort.  No retractions. Lungs CTAB. Gastrointestinal: Soft with generalized tenderness throughout lower abdomen, mild rebound tenderness.  No CVA tenderness Musculoskeletal: No lower extremity tenderness nor edema. No gross deformities of extremities. Neurologic:  Normal speech and language. No gross focal neurologic deficits are appreciated.  Skin:  Skin is warm, dry and intact. No rash noted. Psychiatric: Mood and affect are normal. Speech and behavior are normal.  ____________________________________________   LABS (all labs ordered are listed, but only abnormal results are displayed)  Labs Reviewed  COMPREHENSIVE METABOLIC PANEL - Abnormal; Notable for the following:       Result Value   Glucose, Bld 112 (*)    All other components within normal limits  CBC - Abnormal; Notable for the following:    WBC 11.5 (*)    All other components within normal limits  URINALYSIS, COMPLETE (UACMP) WITH MICROSCOPIC - Abnormal; Notable for the following:    Color, Urine YELLOW (*)    APPearance CLEAR (*)    Ketones, ur 5 (*)    All other components within normal limits  LIPASE, BLOOD   ____________________________________________  EKG  ED ECG REPORT I, Tayonna Bacha, the attending physician, personally viewed and interpreted this  ECG.  Date: 09/06/2016 EKG Time: 19:37 Rate: 108 Rhythm: atrial fibrillation QRS Axis: normal Intervals: a-fib, RBBB and LAFB ST/T Wave abnormalities: Non-specific ST segment / T-wave changes, but no evidence of acute ischemia. Conduction Disturbances: none Narrative Interpretation: unremarkable  ____________________________________________  RADIOLOGY   Ct Abdomen Pelvis W Contrast  Result Date: 09/06/2016 CLINICAL DATA:  Lower abdominal pain with nausea since Saturday EXAM: CT ABDOMEN AND PELVIS WITH CONTRAST TECHNIQUE: Multidetector CT imaging of the abdomen and pelvis was performed using the standard protocol following bolus administration of intravenous contrast. CONTRAST:  163mL ISOVUE-300 IOPAMIDOL (ISOVUE-300) INJECTION 61% COMPARISON:  None. FINDINGS: Lower chest: Trace right pleural effusion. Bilateral lung base atelectasis. Hepatobiliary: No focal liver abnormality is seen. No gallstones, gallbladder wall thickening, or biliary dilatation. Pancreas: Unremarkable. No pancreatic ductal dilatation or surrounding inflammatory changes. Spleen: Normal in size without focal abnormality. Adrenals/Urinary Tract: Adrenal glands are unremarkable. Kidneys are normal, without renal calculi,  focal lesion, or hydronephrosis. Bladder is unremarkable. Stomach/Bowel: Stomach is within normal limits. Dilated appendix measuring 14 mm with appendiceal wall thickening and periappendiceal inflammatory changes consistent with acute appendicitis. No evidence of bowel wall thickening, distention, or inflammatory changes. Vascular/Lymphatic: Normal caliber abdominal aorta with atherosclerosis. No lymphadenopathy. Reproductive: Prostatic enlargement measuring 7.2 x 5.7 cm. Other: No abdominal wall hernia or abnormality. No abdominopelvic ascites. Musculoskeletal: No lytic or sclerotic osseous lesion. No acute fracture or dislocation. 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 IMPRESSION: 1. Findings most consistent with acute appendicitis. 2. Prostatomegaly. Electronically Signed   By: Kathreen Devoid   On: 09/06/2016 18:59    ____________________________________________   PROCEDURES  Procedure(s) performed:   Procedures   Critical Care performed: No ____________________________________________   INITIAL IMPRESSION / ASSESSMENT AND PLAN / ED COURSE  Pertinent labs & imaging results that were available during my care of the patient were reviewed by me and considered in my medical decision making (see chart for details).  Nephrolithiasis is certainly possible given the way the pain radiates to (or from) his right flank, but I am concerned about appendicitis given the tenderness to palpation of his abdomen and mild rebound tenderness.  He has only a very mild leukocytosis which is reassuring and he is not in significant distress.  I tried morphine, Zofran, 1 L of fluids, and we will obtain a CT scan with oral and IV contrast for further evaluation.   Clinical Course as of Sep 06 1945  Mon Sep 06, 2016  1914 I called and spoke by phone with Dr. Dahlia Byes who will come to the emergency department and evaluate the patient and admit him for further management of his acute appendicitis.  I updated the patient and he states his pain has improved significantly after the morphine and he does not want additional pain medication.  He is still tachycardic and I will give him some more IV fluids, but I will hold on antibiotics at this time for Dr. Dahlia Byes to be able to provide his choice of preoperative antibiotics given that the CT scan indicates the appendicitis is uncomplicated.  [CF]    Clinical Course User Index [CF] Hinda Kehr, MD    ____________________________________________  FINAL CLINICAL IMPRESSION(S) / ED DIAGNOSES  Final diagnoses:  Acute appendicitis with generalized peritonitis     MEDICATIONS GIVEN DURING THIS VISIT:  Medications    piperacillin-tazobactam (ZOSYN) IVPB 3.375 g (not administered)  sodium chloride 0.9 % bolus 1,000 mL (0 mLs Intravenous Stopped 09/06/16 1800)  morphine 4 MG/ML injection 4 mg (4 mg Intravenous Given 09/06/16 1706)  ondansetron (ZOFRAN) injection 4 mg (4 mg Intravenous Given 09/06/16 1706)  iopamidol (ISOVUE-300) 61 % injection 30 mL (30 mLs Oral Contrast Given 09/06/16 1658)  iopamidol (ISOVUE-300) 61 % injection 100 mL (100 mLs Intravenous Contrast Given 09/06/16 1843)  sodium chloride 0.9 % bolus 1,000 mL (1,000 mLs Intravenous New Bag/Given 09/06/16 1931)     NEW OUTPATIENT MEDICATIONS STARTED DURING THIS VISIT:  New Prescriptions   No medications on file    Modified Medications   No medications on file    Discontinued Medications   No medications on file     Note:  This document was prepared using Dragon voice recognition software and may include unintentional dictation errors.    Hinda Kehr, MD 09/06/16 [phone]

## 2016-09-06 NOTE — Anesthesia Procedure Notes (Signed)
Procedure Name: Intubation Date/Time: 09/06/2016 11:23 PM Performed by: Lendon Colonel Pre-anesthesia Checklist: Patient identified, Emergency Drugs available and Suction available Patient Re-evaluated:Patient Re-evaluated prior to inductionPreoxygenation: Pre-oxygenation with 100% oxygen Intubation Type: IV induction Laryngoscope Size: Miller and 2 Grade View: Grade III Tube type: Oral Number of attempts: 1 Airway Equipment and Method: Stylet Placement Confirmation: ETT inserted through vocal cords under direct vision,  positive ETCO2 and breath sounds checked- equal and bilateral Secured at: 22 cm Tube secured with: Tape Dental Injury: Teeth and Oropharynx as per pre-operative assessment

## 2016-09-07 DIAGNOSIS — I4891 Unspecified atrial fibrillation: Secondary | ICD-10-CM | POA: Diagnosis present

## 2016-09-07 DIAGNOSIS — K352 Acute appendicitis with generalized peritonitis: Secondary | ICD-10-CM | POA: Diagnosis present

## 2016-09-07 DIAGNOSIS — K3580 Unspecified acute appendicitis: Secondary | ICD-10-CM | POA: Diagnosis present

## 2016-09-07 DIAGNOSIS — Z888 Allergy status to other drugs, medicaments and biological substances status: Secondary | ICD-10-CM | POA: Diagnosis not present

## 2016-09-07 DIAGNOSIS — Z6833 Body mass index (BMI) 33.0-33.9, adult: Secondary | ICD-10-CM | POA: Diagnosis not present

## 2016-09-07 DIAGNOSIS — Z7982 Long term (current) use of aspirin: Secondary | ICD-10-CM | POA: Diagnosis not present

## 2016-09-07 DIAGNOSIS — Z79899 Other long term (current) drug therapy: Secondary | ICD-10-CM | POA: Diagnosis not present

## 2016-09-07 DIAGNOSIS — G4730 Sleep apnea, unspecified: Secondary | ICD-10-CM | POA: Diagnosis present

## 2016-09-07 DIAGNOSIS — I10 Essential (primary) hypertension: Secondary | ICD-10-CM | POA: Diagnosis present

## 2016-09-07 DIAGNOSIS — E669 Obesity, unspecified: Secondary | ICD-10-CM | POA: Diagnosis present

## 2016-09-07 HISTORY — DX: Unspecified acute appendicitis: K35.80

## 2016-09-07 LAB — CBC
HEMATOCRIT: 38.1 % — AB (ref 40.0–52.0)
HEMOGLOBIN: 13 g/dL (ref 13.0–18.0)
MCH: 31.5 pg (ref 26.0–34.0)
MCHC: 34.1 g/dL (ref 32.0–36.0)
MCV: 92.2 fL (ref 80.0–100.0)
PLATELETS: 151 10*3/uL (ref 150–440)
RBC: 4.13 MIL/uL — AB (ref 4.40–5.90)
RDW: 13.5 % (ref 11.5–14.5)
WBC: 12.2 10*3/uL — ABNORMAL HIGH (ref 3.8–10.6)

## 2016-09-07 LAB — BASIC METABOLIC PANEL
ANION GAP: 7 (ref 5–15)
BUN: 18 mg/dL (ref 6–20)
CALCIUM: 8 mg/dL — AB (ref 8.9–10.3)
CHLORIDE: 106 mmol/L (ref 101–111)
CO2: 23 mmol/L (ref 22–32)
Creatinine, Ser: 1.16 mg/dL (ref 0.61–1.24)
GFR calc non Af Amer: >60 mL/min (ref >60)
GLUCOSE: 115 mg/dL — AB (ref 65–99)
Potassium: 4.2 mmol/L (ref 3.5–5.1)
Sodium: 136 mmol/L (ref 135–145)

## 2016-09-07 LAB — MAGNESIUM: Magnesium: 1.6 mg/dL — ABNORMAL LOW (ref 1.7–2.4)

## 2016-09-07 MED ORDER — KETOROLAC TROMETHAMINE 30 MG/ML IJ SOLN
30.0000 mg | Freq: Four times a day (QID) | INTRAMUSCULAR | Status: DC
  Administered 2016-09-07 – 2016-09-08 (×7): 30 mg via INTRAVENOUS
  Filled 2016-09-07 (×8): qty 1

## 2016-09-07 MED ORDER — OXYCODONE HCL 5 MG PO TABS: 5.0000 mg | ORAL_TABLET | Freq: Once | ORAL | Status: DC | PRN

## 2016-09-07 MED ORDER — ALBUMIN HUMAN 25 % IV SOLN
12.5000 g | Freq: Once | INTRAVENOUS | Status: AC
  Administered 2016-09-07: 12.5 g via INTRAVENOUS
  Filled 2016-09-07: qty 50

## 2016-09-07 MED ORDER — MEPERIDINE HCL 50 MG/ML IJ SOLN: 6.2500 mg | INTRAMUSCULAR | Status: DC | PRN

## 2016-09-07 MED ORDER — MORPHINE SULFATE (PF) 4 MG/ML IV SOLN: 2.0000 mg | INTRAVENOUS | Status: DC | PRN

## 2016-09-07 MED ORDER — SUGAMMADEX SODIUM 500 MG/5ML IV SOLN
INTRAVENOUS | Status: AC
  Filled 2016-09-07: qty 5

## 2016-09-07 MED ORDER — SUGAMMADEX SODIUM 500 MG/5ML IV SOLN
INTRAVENOUS | Status: DC | PRN
Start: 2016-09-07 — End: 2016-09-07
  Administered 2016-09-07: 199.6 mg via INTRAVENOUS

## 2016-09-07 MED ORDER — PROMETHAZINE HCL 25 MG/ML IJ SOLN: 6.2500 mg | INTRAMUSCULAR | Status: DC | PRN

## 2016-09-07 MED ORDER — ROCURONIUM BROMIDE 50 MG/5ML IV SOLN
INTRAVENOUS | Status: AC
  Filled 2016-09-07: qty 1

## 2016-09-07 MED ORDER — LACTATED RINGERS IV SOLN
INTRAVENOUS | Status: DC
  Administered 2016-09-07 (×2): via INTRAVENOUS

## 2016-09-07 MED ORDER — FENTANYL CITRATE (PF) 100 MCG/2ML IJ SOLN: 25.0000 ug | INTRAMUSCULAR | Status: DC | PRN

## 2016-09-07 MED ORDER — ROCURONIUM BROMIDE 100 MG/10ML IV SOLN
INTRAVENOUS | Status: DC | PRN
  Administered 2016-09-06: 10 mg via INTRAVENOUS
  Administered 2016-09-06: 40 mg via INTRAVENOUS
  Administered 2016-09-07: 10 mg via INTRAVENOUS

## 2016-09-07 MED ORDER — OXYCODONE HCL 5 MG/5ML PO SOLN: 5.0000 mg | Freq: Once | ORAL | Status: DC | PRN

## 2016-09-07 MED ORDER — ONDANSETRON 4 MG PO TBDP: 4.0000 mg | ORAL_TABLET | Freq: Four times a day (QID) | ORAL | Status: DC | PRN

## 2016-09-07 MED ORDER — ACETAMINOPHEN 500 MG PO TABS
1000.0000 mg | ORAL_TABLET | Freq: Four times a day (QID) | ORAL | Status: DC
  Administered 2016-09-09: 1000 mg via ORAL
  Filled 2016-09-07: qty 2

## 2016-09-07 MED ORDER — ACETAMINOPHEN 10 MG/ML IV SOLN
INTRAVENOUS | Status: DC | PRN
  Administered 2016-09-06: 1000 mg via INTRAVENOUS

## 2016-09-07 MED ORDER — ONDANSETRON HCL 4 MG/2ML IJ SOLN
INTRAMUSCULAR | Status: DC | PRN
  Administered 2016-09-07: 4 mg via INTRAVENOUS

## 2016-09-07 MED ORDER — DIPHENHYDRAMINE HCL 50 MG/ML IJ SOLN: 12.5000 mg | Freq: Four times a day (QID) | INTRAMUSCULAR | Status: DC | PRN

## 2016-09-07 MED ORDER — PHENYLEPHRINE HCL 10 MG/ML IJ SOLN
INTRAMUSCULAR | Status: DC | PRN
  Administered 2016-09-06 (×5): 100 ug via INTRAVENOUS

## 2016-09-07 MED ORDER — ENOXAPARIN SODIUM 40 MG/0.4ML ~~LOC~~ SOLN: 40.0000 mg | SUBCUTANEOUS | Status: DC

## 2016-09-07 MED ORDER — DIPHENHYDRAMINE HCL 12.5 MG/5ML PO ELIX: 12.5000 mg | ORAL_SOLUTION | Freq: Four times a day (QID) | ORAL | Status: DC | PRN

## 2016-09-07 MED ORDER — PIPERACILLIN-TAZOBACTAM 3.375 G IVPB
3.3750 g | Freq: Three times a day (TID) | INTRAVENOUS | Status: DC
  Administered 2016-09-07 – 2016-09-08 (×4): 3.375 g via INTRAVENOUS
  Filled 2016-09-07 (×3): qty 50

## 2016-09-07 MED ORDER — BUPIVACAINE-EPINEPHRINE 0.25% -1:200000 IJ SOLN
INTRAMUSCULAR | Status: DC | PRN
  Administered 2016-09-07: 30 mL

## 2016-09-07 MED ORDER — KETOROLAC TROMETHAMINE 30 MG/ML IJ SOLN: 30.0000 mg | Freq: Four times a day (QID) | INTRAMUSCULAR | Status: DC | PRN

## 2016-09-07 MED ORDER — ONDANSETRON HCL 4 MG/2ML IJ SOLN
4.0000 mg | Freq: Four times a day (QID) | INTRAMUSCULAR | Status: DC | PRN
  Administered 2016-09-08: 4 mg via INTRAVENOUS
  Filled 2016-09-07: qty 2

## 2016-09-07 MED ORDER — PANTOPRAZOLE SODIUM 40 MG IV SOLR
40.0000 mg | Freq: Every day | INTRAVENOUS | Status: DC
  Administered 2016-09-07 – 2016-09-08 (×3): 40 mg via INTRAVENOUS
  Filled 2016-09-07 (×3): qty 40

## 2016-09-07 MED ORDER — OXYCODONE HCL 5 MG PO TABS
5.0000 mg | ORAL_TABLET | ORAL | Status: DC | PRN
  Filled 2016-09-07: qty 1

## 2016-09-07 NOTE — Anesthesia Post-op Follow-up Note (Cosign Needed)
Anesthesia QCDR form completed.        

## 2016-09-07 NOTE — Progress Notes (Signed)
Called Dr. Dahlia Byes regarding patient preference of ibuprofen over tylenol.  Doctor stated that toradol and ibuprofen cannot be given together.  Will continue to give patient scheduled toradol only.  Jonathan Chung  09/07/2016   3:25 AM

## 2016-09-07 NOTE — Op Note (Signed)
laparascopic appendectomy   Laurian Brim Date of operation:  09/07/2016  Indications: The patient presented with a history of  abdominal pain. Workup has revealed findings consistent with acute appendicitis.  Pre-operative Diagnosis: Acute appendicitis without mention of peritonitis  Post-operative Diagnosis: Ruptured Appendicitis  Surgeon: Caroleen Hamman, MD, FACS  Anesthesia: General with endotracheal tube  Findings: Ruptured appendicitis                  Significant inflammatory response around TI                   Thick adhesions from the omentum to the abdominal wall                  Umbilical mesh incorporated                      Estimated Blood Loss: 10cc         Specimens: appendix         Complications:  none  Procedure Details  The patient was seen again in the preop area. The options of surgery versus observation were reviewed with the patient and/or family. The risks of bleeding, infection, recurrence of symptoms, negative laparoscopy, potential for an open procedure, bowel injury, abscess or infection, were all reviewed as well. The patient was taken to Operating Room, identified as Jonathan Chung and the procedure verified as laparoscopic appendectomy. A Time Out was held and the above information confirmed.  The patient was placed in the supine position and general anesthesia was induced.  Antibiotic prophylaxis was administered and VT E prophylaxis was in place.  The abdomen was prepped and draped in a sterile fashion. A supraumbilical incision was made away from the previous UH repair scar. A cutdown technique was used to enter the abdominal cavity under direct visualization. Two vicryl stitches were placed on the fascia and a Hasson trocar inserted. Pneumoperitoneum obtained. Two 5 mm ports were placed under direct visualization.   The appendix was identified and found to be acutely inflamed with some areas of necrosis and purulent fluid around it, It was  perforated. The appendix was carefully dissected. The mesoappendix was divided with Harmonic scalpel. The base of the appendix was dissected out and divided with a standard load Endo GIA. The appendix was passed out through thel port site with the aid of an Endo Catch bag. The right lower quadrant and pelvis was then irrigated with copious amounts of normal saline which was aspirated. Inspection  failed to identify any additional bleeding and there were no signs of bowel injury.  A 19 FR blake drain was placed on the RLQ and secured w 3-0 nylons. Fascia was closed with 0 Vicryl interrupted sutures,. The skin was left open and wet/dry was placed due to the contamination.  The patient tolerated the procedure well, there were no complications. The sponge lap and needle count were correct at the end of the procedure.  The patient was taken to the recovery room in stable condition to be admitted for continued care.    Caroleen Hamman, MD FACS

## 2016-09-07 NOTE — Progress Notes (Signed)
1 Day Post-Op   Subjective:  Patient reports feeling better than before surgery. He has some discomfort at the drain site. Otherwise no other complaints.  Vital signs in last 24 hours: Temp:  [97.9 F (36.6 C)-99.4 F (37.4 C)] 98.9 F (37.2 C) (02/27 1225) Pulse Rate:  [86-140] 86 (02/27 1225) Resp:  [16-36] 20 (02/27 1225) BP: (97-143)/(62-94) 108/67 (02/27 1225) SpO2:  [89 %-99 %] 95 % (02/27 1225) Weight:  [99.8 kg (220 lb)-106.5 kg (234 lb 14.4 oz)] 106.5 kg (234 lb 14.4 oz) (02/27 0157) Last BM Date: 09/06/16  Intake/Output from previous day: 02/26 0701 - 02/27 0700 In: 3130 [P.O.:30; I.V.:1100; IV Piggyback:2000] Out: 675 [Urine:600; Drains:75]  GI: Abdomen soft, appropriately tender to palpation at the incision sites, nondistended. JP in place with a serosanguineous output. No evidence of purulent drainage, cellulitis, erythema.  Lab Results:  CBC  Recent Labs  09/06/16 1608 09/07/16 0349  WBC 11.5* 12.2*  HGB 14.6 13.0  HCT 43.2 38.1*  PLT 178 151   CMP     Component Value Date/Time   NA 136 09/07/2016 0349   NA 139 04/09/2012 0727   K 4.2 09/07/2016 0349   K 3.6 04/09/2012 0727   CL 106 09/07/2016 0349   CL 104 04/09/2012 0727   CO2 23 09/07/2016 0349   CO2 27 04/09/2012 0727   GLUCOSE 115 (H) 09/07/2016 0349   GLUCOSE 109 (H) 04/09/2012 0727   BUN 18 09/07/2016 0349   BUN 22 (H) 04/09/2012 0727   CREATININE 1.16 09/07/2016 0349   CREATININE 1.10 04/09/2012 0727   CALCIUM 8.0 (L) 09/07/2016 0349   CALCIUM 9.2 04/09/2012 0727   PROT 7.5 09/06/2016 1608   ALBUMIN 4.2 09/06/2016 1608   AST 19 09/06/2016 1608   ALT 19 09/06/2016 1608   ALKPHOS 58 09/06/2016 1608   BILITOT 0.8 09/06/2016 1608   GFRNONAA >60 09/07/2016 0349   GFRNONAA >60 04/09/2012 0727   GFRAA >60 09/07/2016 0349   GFRAA >60 04/09/2012 0727   PT/INR No results for input(s): LABPROT, INR in the last 72 hours.  Studies/Results: Ct Abdomen Pelvis W Contrast  Result Date:  09/06/2016 CLINICAL DATA:  Lower abdominal pain with nausea since Saturday EXAM: CT ABDOMEN AND PELVIS WITH CONTRAST TECHNIQUE: Multidetector CT imaging of the abdomen and pelvis was performed using the standard protocol following bolus administration of intravenous contrast. CONTRAST:  123mL ISOVUE-300 IOPAMIDOL (ISOVUE-300) INJECTION 61% COMPARISON:  None. FINDINGS: Lower chest: Trace right pleural effusion. Bilateral lung base atelectasis. Hepatobiliary: No focal liver abnormality is seen. No gallstones, gallbladder wall thickening, or biliary dilatation. Pancreas: Unremarkable. No pancreatic ductal dilatation or surrounding inflammatory changes. Spleen: Normal in size without focal abnormality. Adrenals/Urinary Tract: Adrenal glands are unremarkable. Kidneys are normal, without renal calculi, focal lesion, or hydronephrosis. Bladder is unremarkable. Stomach/Bowel: Stomach is within normal limits. Dilated appendix measuring 14 mm with appendiceal wall thickening and periappendiceal inflammatory changes consistent with acute appendicitis. No evidence of bowel wall thickening, distention, or inflammatory changes. Vascular/Lymphatic: Normal caliber abdominal aorta with atherosclerosis. No lymphadenopathy. Reproductive: Prostatic enlargement measuring 7.2 x 5.7 cm. Other: No abdominal wall hernia or abnormality. No abdominopelvic ascites. Musculoskeletal: No lytic or sclerotic osseous lesion. No acute fracture or dislocation. 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 IMPRESSION: 1. Findings most consistent with acute appendicitis. 2. Prostatomegaly. Electronically Signed   By: Kathreen Devoid   On: 09/06/2016 18:59    Assessment/Plan: 66 year old male status post laparoscopic appendectomy  for a ruptured appendicitis. Doing well. Discussed the need to continue IV antibiotics. Encourage ambulation, incentive spirometer usage, oral intake.   Clayburn Pert, MD  [facility]  [facility] [phone] [facility] [phone]  09/07/2016

## 2016-09-07 NOTE — Anesthesia Postprocedure Evaluation (Signed)
Anesthesia Post Note  Patient: Jonathan Chung  Procedure(s) Performed: Procedure(s) (LRB): APPENDECTOMY LAPAROSCOPIC (N/A)  Patient location during evaluation: PACU Anesthesia Type: General Level of consciousness: awake and alert and oriented Pain management: pain level controlled Vital Signs Assessment: post-procedure vital signs reviewed and stable Respiratory status: spontaneous breathing, nonlabored ventilation and respiratory function stable Cardiovascular status: blood pressure returned to baseline and stable Postop Assessment: no signs of nausea or vomiting Anesthetic complications: no     Last Vitals:  Vitals:   09/07/16 0145 09/07/16 0157  BP: 106/68 107/72  Pulse: (!) 104 (!) 103  Resp: (!) 21 18  Temp:  37.4 C    Last Pain:  Vitals:   09/07/16 0157  TempSrc: Oral  PainSc:                  Anokhi Shannon

## 2016-09-07 NOTE — Plan of Care (Signed)
Problem: Fluid Volume: Goal: Ability to maintain a balanced intake and output will improve Outcome: Not Progressing Patient is NPO except ice chips.  Problem: Nutrition: Goal: Adequate nutrition will be maintained Outcome: Not Progressing Patient is NPO except ice chips.   

## 2016-09-07 NOTE — Transfer of Care (Signed)
Immediate Anesthesia Transfer of Care Note  Patient: Jonathan Chung  Procedure(s) Performed: Procedure(s): APPENDECTOMY LAPAROSCOPIC (N/A)  Patient Location: PACU  Anesthesia Type:General  Level of Consciousness: sedated and confused  Airway & Oxygen Therapy: Patient Spontanous Breathing and Patient connected to face mask oxygen  Post-op Assessment: Report given to RN and Post -op Vital signs reviewed and stable  Post vital signs: Reviewed and stable  Last Vitals:  Vitals:   09/07/16 0045 09/07/16 0050  BP:  111/62  Pulse:  (!) 119  Resp: (!) 28 (!) 36  Temp:      Last Pain:  Vitals:   09/06/16 2117  TempSrc:   PainSc: 5          Complications: No apparent anesthesia complications

## 2016-09-08 ENCOUNTER — Encounter: Payer: Self-pay | Admitting: Surgery

## 2016-09-08 LAB — BASIC METABOLIC PANEL
Anion gap: 8 (ref 5–15)
BUN: 22 mg/dL — AB (ref 6–20)
CALCIUM: 8.2 mg/dL — AB (ref 8.9–10.3)
CO2: 26 mmol/L (ref 22–32)
Chloride: 103 mmol/L (ref 101–111)
Creatinine, Ser: 1.16 mg/dL (ref 0.61–1.24)
GFR calc Af Amer: >60 mL/min (ref >60)
GLUCOSE: 115 mg/dL — AB (ref 65–99)
POTASSIUM: 3.8 mmol/L (ref 3.5–5.1)
Sodium: 137 mmol/L (ref 135–145)

## 2016-09-08 LAB — SURGICAL PATHOLOGY

## 2016-09-08 LAB — CBC
HEMATOCRIT: 37.2 % — AB (ref 40.0–52.0)
Hemoglobin: 12.7 g/dL — ABNORMAL LOW (ref 13.0–18.0)
MCH: 31.7 pg (ref 26.0–34.0)
MCHC: 34.1 g/dL (ref 32.0–36.0)
MCV: 92.8 fL (ref 80.0–100.0)
Platelets: 141 10*3/uL — ABNORMAL LOW (ref 150–440)
RBC: 4.01 MIL/uL — ABNORMAL LOW (ref 4.40–5.90)
RDW: 13.7 % (ref 11.5–14.5)
WBC: 9.6 10*3/uL (ref 3.8–10.6)

## 2016-09-08 LAB — MAGNESIUM: Magnesium: 2 mg/dL (ref 1.7–2.4)

## 2016-09-08 MED ORDER — AMOXICILLIN-POT CLAVULANATE 875-125 MG PO TABS
1.0000 | ORAL_TABLET | Freq: Two times a day (BID) | ORAL | Status: DC
  Administered 2016-09-08 – 2016-09-09 (×3): 1 via ORAL
  Filled 2016-09-08 (×4): qty 1

## 2016-09-08 MED ORDER — ENOXAPARIN SODIUM 40 MG/0.4ML ~~LOC~~ SOLN
40.0000 mg | SUBCUTANEOUS | Status: DC
  Administered 2016-09-08: 40 mg via SUBCUTANEOUS
  Filled 2016-09-08: qty 0.4

## 2016-09-08 MED ORDER — KETOROLAC TROMETHAMINE 30 MG/ML IJ SOLN: 30.0000 mg | Freq: Four times a day (QID) | INTRAMUSCULAR | Status: DC | PRN

## 2016-09-08 NOTE — Progress Notes (Signed)
2 Days Post-Op   Subjective:  Patient reports feeling weak but otherwise not really having any pain. He has been tolerating a diet and passing gas.  Vital signs in last 24 hours: Temp:  [98.1 F (36.7 C)-99.5 F (37.5 C)] 98.6 F (37 C) (02/28 1242) Pulse Rate:  [74-123] 74 (02/28 1242) Resp:  [14-20] 20 (02/28 1242) BP: (122-149)/(72-95) 149/95 (02/28 1242) SpO2:  [94 %-98 %] 98 % (02/28 1242) Last BM Date: 09/06/16  Intake/Output from previous day: 02/27 0701 - 02/28 0700 In: 2886.3 [P.O.:120; I.V.:2616.3; IV Piggyback:150] Out: [id number] [Urine:1300; Drains:295]  GI: Abdomen is soft, appropriately tender to palpation of his incision sites, nondistended. JP in place with minimal serosanguineous output. No evidence of erythema, purulence, peritonitis.  Lab Results:  CBC  Recent Labs  09/07/16 0349 09/08/16 0409  WBC 12.2* 9.6  HGB 13.0 12.7*  HCT 38.1* 37.2*  PLT 151 141*   CMP     Component Value Date/Time   NA 137 09/08/2016 0409   NA 139 04/09/2012 0727   K 3.8 09/08/2016 0409   K 3.6 04/09/2012 0727   CL 103 09/08/2016 0409   CL 104 04/09/2012 0727   CO2 26 09/08/2016 0409   CO2 27 04/09/2012 0727   GLUCOSE 115 (H) 09/08/2016 0409   GLUCOSE 109 (H) 04/09/2012 0727   BUN 22 (H) 09/08/2016 0409   BUN 22 (H) 04/09/2012 0727   CREATININE 1.16 09/08/2016 0409   CREATININE 1.10 04/09/2012 0727   CALCIUM 8.2 (L) 09/08/2016 0409   CALCIUM 9.2 04/09/2012 0727   PROT 7.5 09/06/2016 1608   ALBUMIN 4.2 09/06/2016 1608   AST 19 09/06/2016 1608   ALT 19 09/06/2016 1608   ALKPHOS 58 09/06/2016 1608   BILITOT 0.8 09/06/2016 1608   GFRNONAA >60 09/08/2016 0409   GFRNONAA >60 04/09/2012 0727   GFRAA >60 09/08/2016 0409   GFRAA >60 04/09/2012 0727   PT/INR No results for input(s): LABPROT, INR in the last 72 hours.  Studies/Results: Ct Abdomen Pelvis W Contrast  Result Date: 09/06/2016 CLINICAL DATA:  Lower abdominal pain with nausea since Saturday EXAM: CT ABDOMEN  AND PELVIS WITH CONTRAST TECHNIQUE: Multidetector CT imaging of the abdomen and pelvis was performed using the standard protocol following bolus administration of intravenous contrast. CONTRAST:  128mL ISOVUE-300 IOPAMIDOL (ISOVUE-300) INJECTION 61% COMPARISON:  None. FINDINGS: Lower chest: Trace right pleural effusion. Bilateral lung base atelectasis. Hepatobiliary: No focal liver abnormality is seen. No gallstones, gallbladder wall thickening, or biliary dilatation. Pancreas: Unremarkable. No pancreatic ductal dilatation or surrounding inflammatory changes. Spleen: Normal in size without focal abnormality. Adrenals/Urinary Tract: Adrenal glands are unremarkable. Kidneys are normal, without renal calculi, focal lesion, or hydronephrosis. Bladder is unremarkable. Stomach/Bowel: Stomach is within normal limits. Dilated appendix measuring 14 mm with appendiceal wall thickening and periappendiceal inflammatory changes consistent with acute appendicitis. No evidence of bowel wall thickening, distention, or inflammatory changes. Vascular/Lymphatic: Normal caliber abdominal aorta with atherosclerosis. No lymphadenopathy. Reproductive: Prostatic enlargement measuring 7.2 x 5.7 cm. Other: No abdominal wall hernia or abnormality. No abdominopelvic ascites. Musculoskeletal: No lytic or sclerotic osseous lesion. No acute fracture or dislocation. 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 IMPRESSION: 1. Findings most consistent with acute appendicitis. 2. Prostatomegaly. Electronically Signed   By: Kathreen Devoid   On: 09/06/2016 18:59    Assessment/Plan: 66 year old male status post laparoscopic appendectomy for perforated appendicitis. Doing well. Antibiotics transition to oral antibiotics today. If continues to do well will  likely be able to be discharged home tomorrow on a course of oral antibiotics.   Clayburn Pert, MD [facility]  [facility] [phone] [facility] [phone]  09/08/2016

## 2016-09-09 LAB — CBC
HCT: 38.9 % — ABNORMAL LOW (ref 40.0–52.0)
HEMOGLOBIN: 13.6 g/dL (ref 13.0–18.0)
MCH: 32.3 pg (ref 26.0–34.0)
MCHC: 35.1 g/dL (ref 32.0–36.0)
MCV: 92.1 fL (ref 80.0–100.0)
Platelets: 183 10*3/uL (ref 150–440)
RBC: 4.23 MIL/uL — ABNORMAL LOW (ref 4.40–5.90)
RDW: 13.4 % (ref 11.5–14.5)
WBC: 11.4 10*3/uL — ABNORMAL HIGH (ref 3.8–10.6)

## 2016-09-09 MED ORDER — OXYCODONE HCL 5 MG PO TABS: 5.0000 mg | ORAL_TABLET | ORAL | 0 refills | Status: DC | PRN

## 2016-09-09 MED ORDER — AMOXICILLIN-POT CLAVULANATE 875-125 MG PO TABS: 1.0000 | ORAL_TABLET | Freq: Two times a day (BID) | ORAL | 0 refills | Status: AC

## 2016-09-09 NOTE — Care Management (Signed)
Patient to discharge today.  At baseline patient lives at home alone.  Over the weekend patient will be staying with a friend, and returning to his home on Monday.  Home health ordered for dressing changes.  Patient states that he does not have a home health agency preference.  Referral made to [facility] with [facility].  Patient to be provided with dressing supplies for his "friend" to assist with dressing changes over the weekend.  Patient to follow up outpatient with surgery on Monday, and home health to initiate dressing changes on Wednesday.  RNCM signing off

## 2016-09-09 NOTE — Discharge Instructions (Signed)
Laparoscopic Appendectomy, Adult, Care After  These instructions give you information about caring for yourself after your procedure. Your doctor may also give you more specific instructions. Call your doctor if you have any problems or questions after your procedure.  Follow these instructions at home:  Medicines   · Take over-the-counter and prescription medicines only as told by your doctor.  · Do not drive for 24 hours if you received a sedative.  · Do not drive or use heavy machinery while taking prescription pain medicine.  · If you were prescribed an antibiotic medicine, take it as told by your doctor. Do not stop taking it even if you start to feel better.  Activity   · Do not lift anything that is heavier than 10 pounds (4.5 kg) for 3 weeks or as told by your doctor.  · Do not play contact sports for 3 weeks or as told by your doctor.  · Slowly return to your normal activities.  Bathing   · Keep your cuts from surgery (incisions) clean and dry.  ? Gently wash the cuts with soap and water.  ? Rinse the cuts with water until the soap is gone.  ? Pat the cuts dry with a clean towel. Do not rub the cuts.  · You may take showers after 48 hours.  · Do not take baths, swim, or use a hot tub for 2 weeks or as told by your doctor.  Cut Care   · Follow instructions from your doctor about how to take care of your cuts. Make sure you:  ? Wash your hands with soap and water before you change your bandage (dressing). If you do not have soap and water, use hand sanitizer.  ? Change your bandage as told by your doctor.  ? Leave stitches (sutures), skin glue, or skin tape (adhesive) strips in place. They may need to stay in place for 2 weeks or longer. If tape strips get loose and curl up, you may trim the loose edges. Do not remove tape strips completely unless your doctor says it is okay.  · Check your cuts every day for signs of infection. Check for:  ? More redness, swelling, or pain.  ? More fluid or  blood.  ? Warmth.  ? Pus or a bad smell.  Other Instructions   · If you were sent home with a drain, follow instructions from your doctor about how to use it and care for it.  · Take deep breaths. This helps to keep your lungs from getting swollen (inflamed).  · To help with constipation:  ? Drink plenty of fluids.  ? Eat plenty of fruits and vegetables.  · Keep all follow-up visits as told by your doctor. This is important.  Contact a doctor if:  · You have more redness, swelling, or pain around a cut from surgery.  · You have more fluid or blood coming from a cut.  · Your cut feels warm to the touch.  · You have pus or a bad smell coming from a cut or a bandage.  · The edges of a cut break open after the stitches have been taken out.  · You have pain in your shoulders that gets worse.  · You feel dizzy or you pass out (faint).  · You have shortness of breath.  · You keep feeling sick to your stomach (nauseous).  · You keep throwing up (vomiting).  · You get diarrhea or you cannot control your   intended to replace advice given to you by your health care provider. Make sure you discuss any questions you have with your health care provider. Document Released: 04/24/2009 Document Revised: 12/04/2015 Document Reviewed: 12/16/2014 Elsevier Interactive Patient Education  2017 [place] Surgical drains are used to remove extra fluid that normally builds up in a surgical wound after surgery. A surgical drain helps to heal a surgical wound. Different kinds of surgical drains include:  Active drains. These drains use suction to pull drainage away from the surgical wound. Drainage flows through a tube to a container outside of the body. It is important to  keep the bulb or the drainage container flat (compressed) at all times, except while you empty it. Flattening the bulb or container creates suction. The two most common types of active drains are bulb drains and Hemovac drains.  Passive drains. These drains allow fluid to drain naturally, by gravity. Drainage flows through a tube to a bandage (dressing) or a container outside of the body. Passive drains do not need to be emptied. The most common type of passive drain is the Penrose drain. A drain is placed during surgery. Immediately after surgery, drainage is usually bright red and a little thicker than water. The drainage may gradually turn yellow or pink and become thinner. It is likely that your health care provider will remove the drain when the drainage stops or when the amount decreases to 1-2 Tbsp (15-30 mL) during a 24-hour period. How to care for your surgical drain  Keep the skin around the drain dry and covered with a dressing at all times.  Check your drain area every day for signs of infection. Check for:  More redness, swelling, or pain.  Pus or a bad smell.  Cloudy drainage. Follow instructions from your health care provider about how to take care of your drain and how to change your dressing. Change your dressing at least one time every day. Change it more often if needed to keep the dressing dry. Make sure you: 1. Gather your supplies, including:  Tape.  Germ-free cleaning solution (sterile saline).  Split gauze drain sponge: 4 x 4 inches (10 x 10 cm).  Gauze square: 4 x 4 inches (10 x 10 cm). 2. Wash your hands with soap and water before you change your dressing. If soap and water are not available, use hand sanitizer. 3. Remove the old dressing. Avoid using scissors to do that. 4. Use sterile saline to clean your skin around the drain. 5. Place the tube through the slit in a drain sponge. Place the drain sponge so that it covers your wound. 6. Place the gauze square  or another drain sponge on top of the drain sponge that is on the wound. Make sure the tube is between those layers. 7. Tape the dressing to your skin. 8. If you have an active bulb or Hemovac drain, tape the drainage tube to your skin 1-2 inches (2.5-5 cm) below the place where the tube enters your body. Taping keeps the tube from pulling on any stitches (sutures) that you have. 9. Wash your hands with soap and water. 10. Write down the color of your drainage and how often you change your dressing. How to empty your active bulb or Hemovac drain 1. Make sure that you have a measuring cup that you can empty your drainage into. 2. Wash your hands with soap and water. If soap and water are not available, use  hand sanitizer. 3. Gently move your fingers down the tube while squeezing very lightly. This is called stripping the tube. This clears any drainage, clots, or tissue from the tube.  Do not pull on the tube.  You may need to strip the tube several times every day to keep the tube clear. 4. Open the bulb cap or the drain plug. Do not touch the inside of the cap or the bottom of the plug. 5. Empty all of the drainage into the measuring cup. 6. Compress the bulb or the container and replace the cap or the plug. To compress the bulb or the container, squeeze it firmly in the middle while you close the cap or plug the container. 7. Write down the amount of drainage that you have in each 24-hour period. If you have less than 2 Tbsp (30 mL) of drainage during 24 hours, contact your health care provider. 8. Flush the drainage down the toilet. 9. Wash your hands with soap and water. Contact a health care provider if:  You have more redness, swelling, or pain around your drain area.  The amount of drainage that you have is increasing instead of decreasing.  You have pus or a bad smell coming from your drain area.  You have a fever.  You have drainage that is cloudy.  There is a sudden stop or a  sudden decrease in the amount of drainage that you have.  Your tube falls out.  Your active draindoes not stay compressedafter you empty it. This information is not intended to replace advice given to you by your health care provider. Make sure you discuss any questions you have with your health care provider. Document Released: 06/25/2000 Document Revised: 12/04/2015 Document Reviewed: 01/15/2015 Elsevier Interactive Patient Education  2017 [employer].

## 2016-09-09 NOTE — Progress Notes (Signed)
Pt d/c to home today.  JP Drain remains intact.  IV removed intact.  Rx's given to pt w/all questions and concerns addressed.  D/C paperwork reviewed and education provided with all questions and concerns addressed.  All incisional dressings changed.  CDI.  Education provided on how to change dressing and empty and record JP drain. Pt family at bedside for home transport.

## 2016-09-09 NOTE — Discharge Summary (Signed)
Patient ID: Jonathan Chung MRN: [id number] DOB/AGE: January 02, 1951 66 y.o.  Admit date: 09/06/2016 Discharge date: 09/09/2016  Discharge Diagnoses:  Ruptured appendicitis  Procedures Performed: Laparoscopic appendectomy  Discharged Condition: good  Hospital Course: Patient taken to OR for appendectomy. Found to have ruptured appendix at time of surgery that required drain placement. Tolerated surgery well. On day of discharge he was pain free, tolerating a regular diet and oral antibiotics. He will have home health for dressing changes and drain care. Skin was left open due to infected field at time of surgery.  Discharge Orders: Discharge Instructions    Call MD for:  difficulty breathing, headache or visual disturbances    Complete by:  As directed    Call MD for:  persistant nausea and vomiting    Complete by:  As directed    Call MD for:  redness, tenderness, or signs of infection (pain, swelling, redness, odor or green/yellow discharge around incision site)    Complete by:  As directed    Call MD for:  severe uncontrolled pain    Complete by:  As directed    Call MD for:  temperature >100.4    Complete by:  As directed    Diet - low sodium heart healthy    Complete by:  As directed    Increase activity slowly    Complete by:  As directed       Disposition: Final discharge disposition not confirmed  Discharge Medications: Allergies as of 09/09/2016      Reactions   Ciprofloxacin Rash      Medication List    TAKE these medications   amoxicillin-clavulanate 875-125 MG tablet Commonly known as:  AUGMENTIN Take 1 tablet by mouth every 12 (twelve) hours.   aspirin 325 MG tablet Take 325 mg by mouth every morning.   cetirizine 10 MG tablet Commonly known as:  ZYRTEC Take 10 mg by mouth daily.   diltiazem 180 MG 24 hr capsule Commonly known as:  CARDIZEM CD Take 180 mg by mouth daily.   hydrochlorothiazide 25 MG tablet Commonly known as:  HYDRODIURIL Take 12.5 mg  by mouth daily.   levothyroxine 50 MCG tablet Commonly known as:  SYNTHROID, LEVOTHROID Take 50 mcg by mouth daily.   loratadine 10 MG tablet Commonly known as:  CLARITIN Take 10 mg by mouth daily.   Omega-3 1000 MG Caps Take 1,000 mg by mouth every morning.   omeprazole 20 MG capsule Commonly known as:  PRILOSEC Take 20 mg by mouth daily.   oxyCODONE 5 MG immediate release tablet Commonly known as:  ROXICODONE Take 1 tablet (5 mg total) by mouth every 4 (four) hours as needed for severe pain.   simvastatin 20 MG tablet Commonly known as:  ZOCOR Take 20 mg by mouth daily.   tamsulosin 0.4 MG Caps capsule Commonly known as:  FLOMAX Take 0.4 mg by mouth daily.   THERA Tabs Take 1 tablet by mouth every morning.   valACYclovir 500 MG tablet Commonly known as:  VALTREX Take 500 mg by mouth 4 (four) times a week. Take 500 mg on Sunday, Tuesday, Thursday and Saturday.   vitamin E 400 UNIT capsule Take 400 Units by mouth every morning.        Follwup: Follow-up Information    Jules Husbands, MD. Go in 4 day(s).   Specialty:  General Surgery Why:  Post op appy with JP drain in place Contact information: [address] [place] [address]  [phone]           Signed: Clayburn Pert 09/09/2016, 11:54 AM

## 2016-09-09 NOTE — Care Management Important Message (Signed)
Important Message  Patient Details  Name: Jonathan Chung MRN: [id number] Date of Birth: 1950/08/12   Medicare Important Message Given:  N/A - LOS <3 / Initial given by admissions    Beverly Sessions, RN 09/09/2016, 1:45 PM

## 2016-09-10 ENCOUNTER — Other Ambulatory Visit: Payer: Self-pay

## 2016-09-13 ENCOUNTER — Encounter: Payer: Self-pay | Admitting: Surgery

## 2016-09-13 ENCOUNTER — Ambulatory Visit (INDEPENDENT_AMBULATORY_CARE_PROVIDER_SITE_OTHER): Payer: Medicare HMO | Admitting: Surgery

## 2016-09-13 VITALS — BP 139/90 | HR 77 | Temp 97.7°F | Ht 70.0 in | Wt 219.0 lb

## 2016-09-13 DIAGNOSIS — Z09 Encounter for follow-up examination after completed treatment for conditions other than malignant neoplasm: Secondary | ICD-10-CM

## 2016-09-13 NOTE — Progress Notes (Signed)
S/p lap appy 2/26 perf. Path d/w pt in detail He continues to improve Serous fluid from JP about 50cc day NO fevers Taking PO and + BM  PE NAD ABD: soft, open supraumbilical incision good granulation tissue, no infection JP removed  A/P Doing well No heavy lifting Continue wound care RTC prn

## 2016-09-13 NOTE — Patient Instructions (Addendum)
Please call our office with any questions or concerns.  Please do not submerge in a tub, hot tub, or pool until incisions are completely sealed.  Use sun block to incision area over the next year if this area will be exposed to sun. This helps decrease scarring.  If you develop redness, drainage, or pain at incision sites- call our office immediately and speak with a nurse.  Please do not lift more than 15 lb for the next 4 weeks (until 10/04/2016).  Remember to apply a 2x2 on the small opening with saline and then a 4x4 gauze to cover it. On your incision where you had the drainage, apply a dry 4x4. Please change your dressing if you see that you are draining too much. Remember that your body will start to reabsorb the fluid.

## 2018-04-13 ENCOUNTER — Ambulatory Visit: Payer: Medicare HMO | Admitting: Internal Medicine

## 2018-09-15 IMAGING — CT CT ABD-PELV W/ CM
2 of 5 series · 16 of 46 positions shown, 18 images · IV contrast (iopamidol)
Comparison: None.

CLINICAL DATA: Lower abdominal pain with nausea since [REDACTED]

EXAM:
CT ABDOMEN AND PELVIS WITH CONTRAST
TECHNIQUE: Multidetector CT imaging of the abdomen and pelvis was performed
using the standard protocol following bolus administration of
intravenous contrast.
CONTRAST:  100mL [id number] IOPAMIDOL ([id number]) INJECTION 61%

[Series 2: routine abd/pel with · axial · 0.84mm/px · z∈[-493,-68]mm · 13 of 97 slices shown, 15 images]
[im 6/97  soft-tissue]
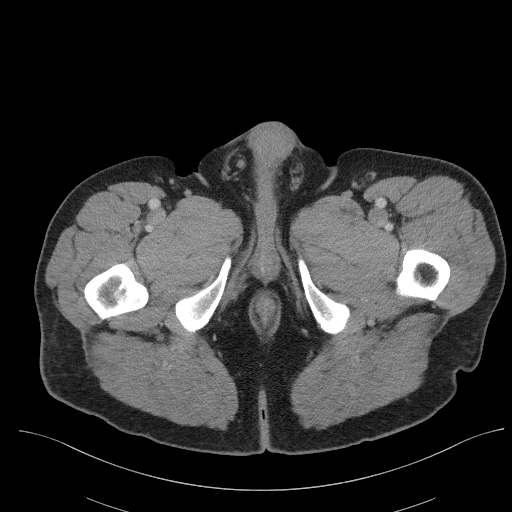
[im 6/97  bone]
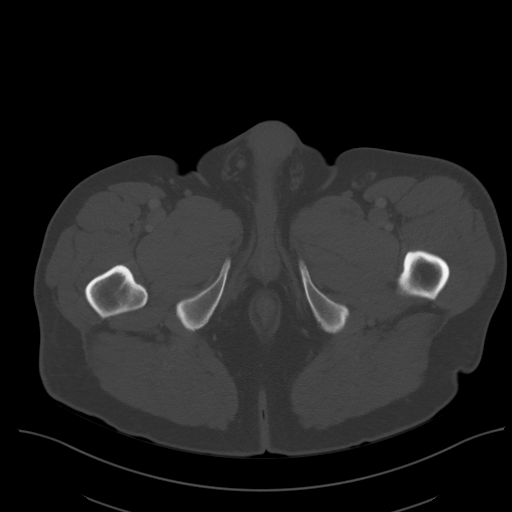
[im 11/97  soft-tissue]
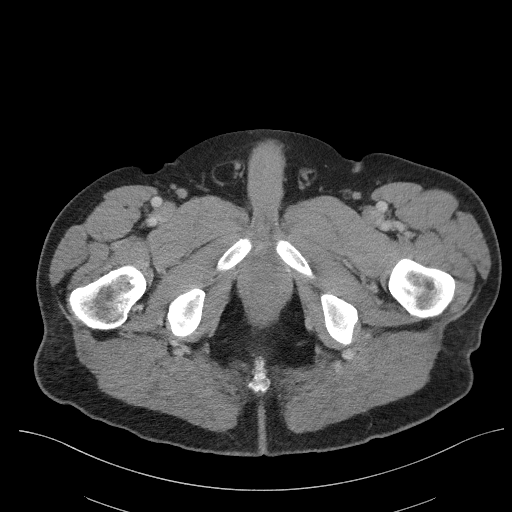
[im 22/97  soft-tissue]
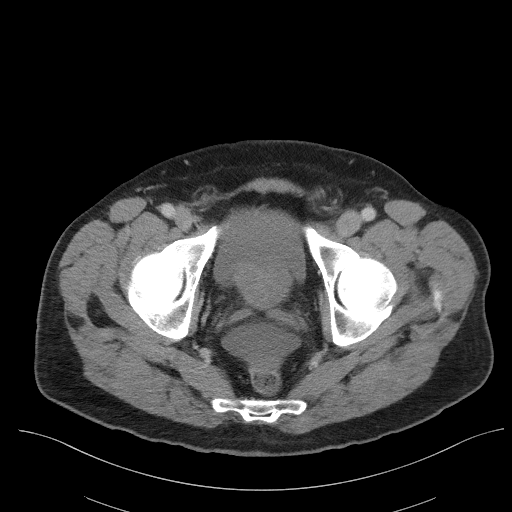
[im 27/97  soft-tissue]
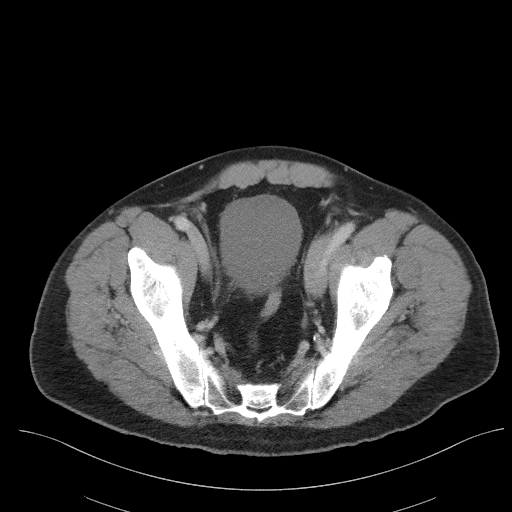
[im 33/97  soft-tissue]
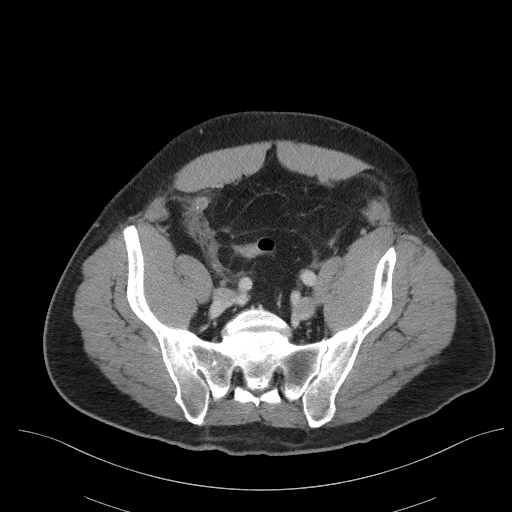
[im 43/97  soft-tissue]
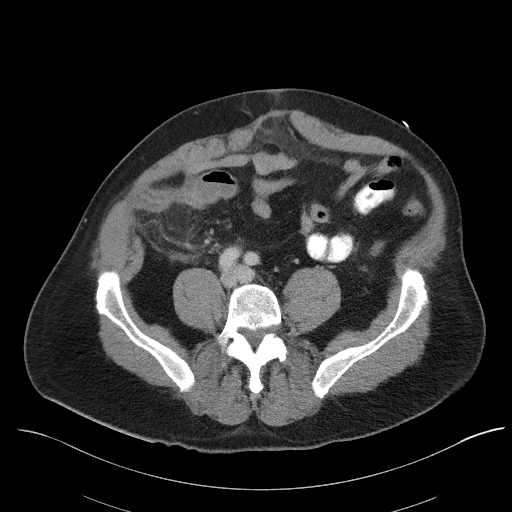
[im 49/97  soft-tissue]
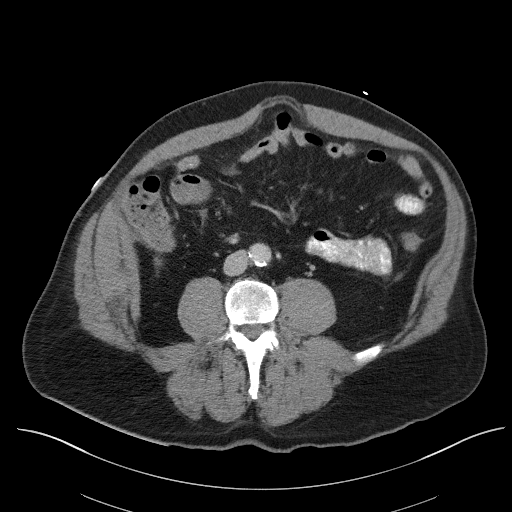
[im 54/97  soft-tissue]
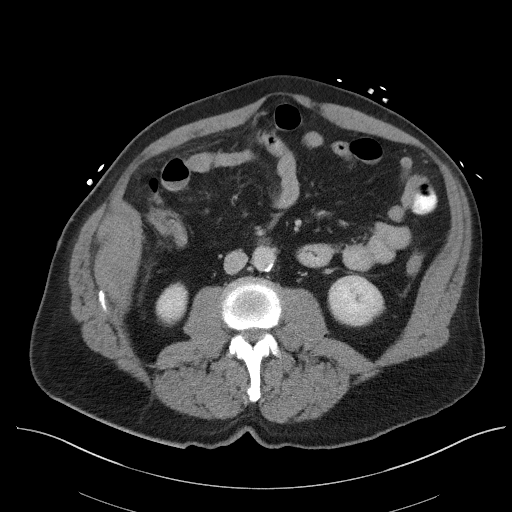
[im 65/97  soft-tissue]
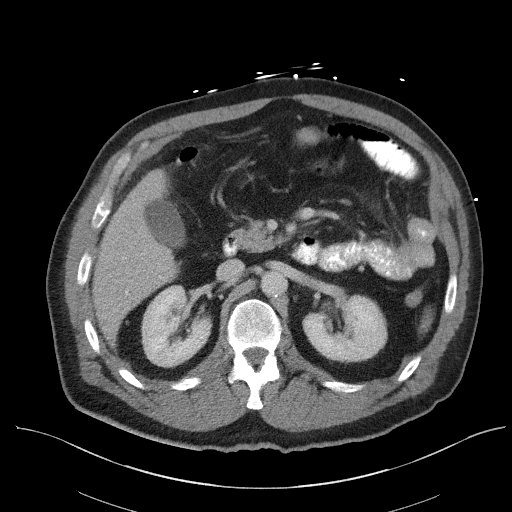
[im 65/97  bone]
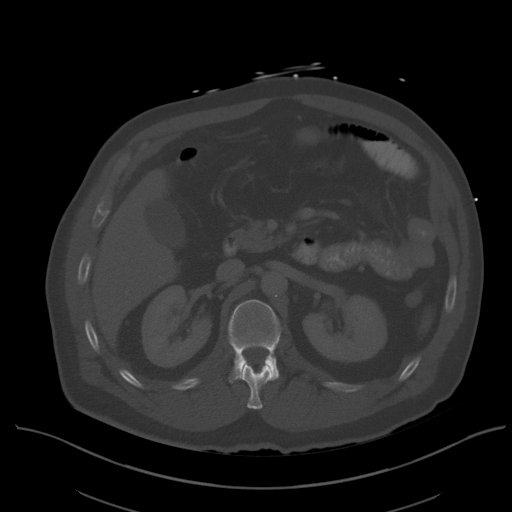
[im 70/97  soft-tissue]
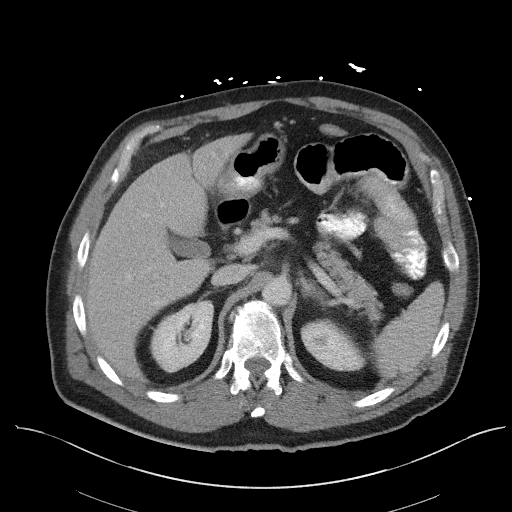
[im 75/97  soft-tissue]
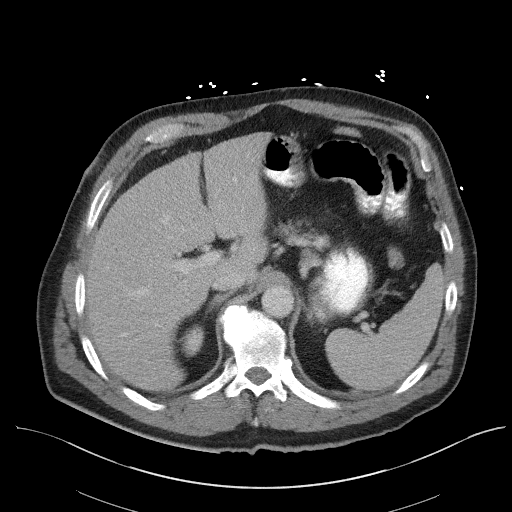
[im 86/97  soft-tissue]
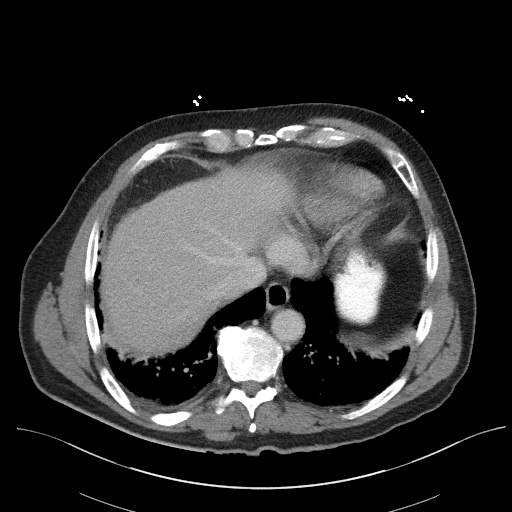
[im 91/97  soft-tissue]
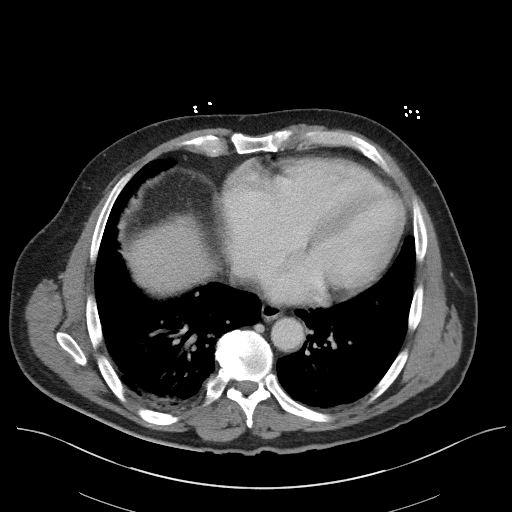

[Series 5: coronal st · coronal · 0.84mm/px · 3 of 102 slices shown]
[im 34/102  soft-tissue]
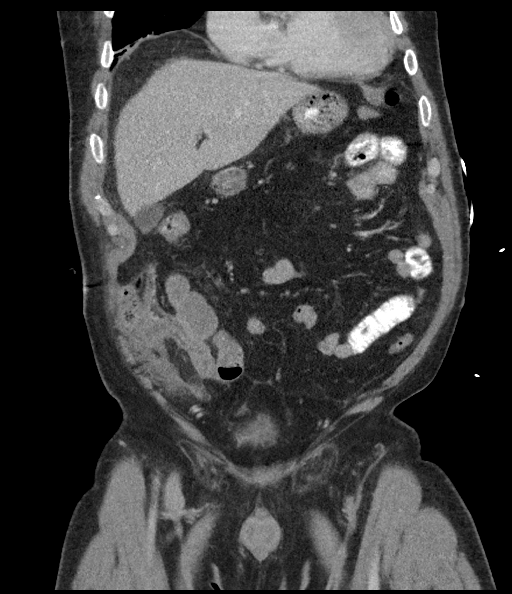
[im 45/102  soft-tissue]
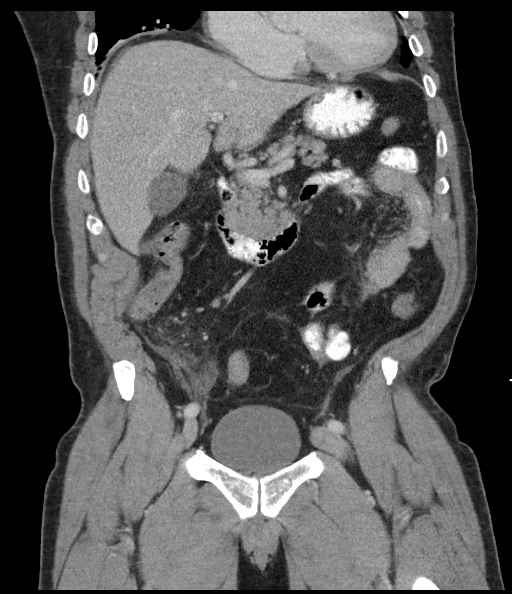
[im 57/102  soft-tissue]
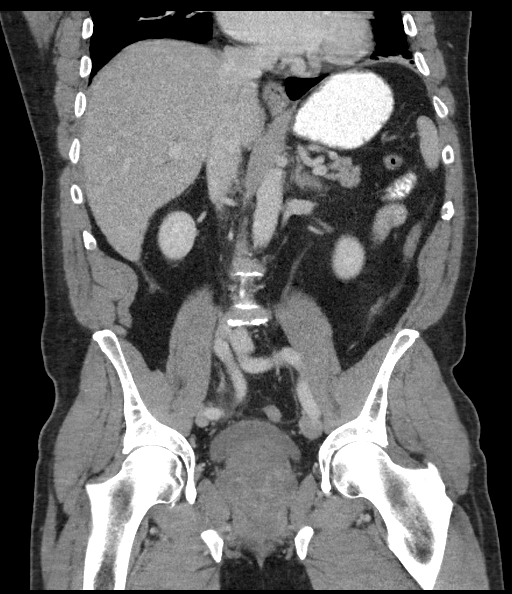

[16 of 46 positions shown; findings below may reference images not displayed]

FINDINGS: Lower chest: Trace right pleural effusion. Bilateral lung base
atelectasis.

Hepatobiliary: No focal liver abnormality is seen. No gallstones,
gallbladder wall thickening, or biliary dilatation.

Pancreas: Unremarkable. No pancreatic ductal dilatation or
surrounding inflammatory changes.

Spleen: Normal in size without focal abnormality.

Adrenals/Urinary Tract: Adrenal glands are unremarkable. Kidneys are
normal, without renal calculi, focal lesion, or hydronephrosis.
Bladder is unremarkable.

Stomach/Bowel: Stomach is within normal limits. Dilated appendix
measuring 14 mm with appendiceal wall thickening and periappendiceal
inflammatory changes consistent with acute appendicitis. No evidence
of bowel wall thickening, distention, or inflammatory changes.

Vascular/Lymphatic: Normal caliber abdominal aorta with
atherosclerosis. No lymphadenopathy.

Reproductive: Prostatic enlargement measuring 7.2 x 5.7 cm.

Other: No abdominal wall hernia or abnormality. No abdominopelvic
ascites.

Musculoskeletal: No lytic or sclerotic osseous lesion. No acute
fracture or dislocation. Bilateral L5 pars interarticularis defects
resulting in grade 1 anterolisthesis of L5 on S1.
IMPRESSION: 1. Findings most consistent with acute appendicitis.
2. Prostatomegaly.

## 2019-08-03 ENCOUNTER — Ambulatory Visit: Payer: Medicare HMO | Attending: Internal Medicine

## 2019-08-03 DIAGNOSIS — Z23 Encounter for immunization: Secondary | ICD-10-CM | POA: Insufficient documentation

## 2019-08-03 NOTE — Progress Notes (Signed)
   Covid-19 Vaccination Clinic  Name:  Jonathan Chung    MRN: [id number] DOB: [birth year]  08/03/2019  Mr. Jonathan Chung was observed post Covid-19 immunization for 15 minutes without incidence. He was provided with Vaccine Information Sheet and instruction to access the V-Safe system.   Mr. Jonathan Chung was instructed to call 911 with any severe reactions post vaccine: Marland Kitchen Difficulty breathing  . Swelling of your face and throat  . A fast heartbeat  . A bad rash all over your body  . Dizziness and weakness    Immunizations Administered    Name Date Dose VIS Date Route   Pfizer COVID-19 Vaccine 08/03/2019 12:51 PM 0.3 mL 06/22/2019 Intramuscular   Manufacturer: [place]   Lot: [id number]   [place]: [id number]

## 2019-08-23 ENCOUNTER — Ambulatory Visit: Payer: Medicare HMO | Attending: Internal Medicine

## 2019-08-23 DIAGNOSIS — Z23 Encounter for immunization: Secondary | ICD-10-CM | POA: Insufficient documentation

## 2019-08-23 NOTE — Progress Notes (Signed)
   Covid-19 Vaccination Clinic  Name:  Jonathan Chung    MRN: [id number] DOB: 1951/03/18  08/23/2019  Jonathan Chung was observed post Covid-19 immunization for 15 minutes without incidence. He was provided with Vaccine Information Sheet and instruction to access the V-Safe system.   Jonathan Chung was instructed to call 911 with any severe reactions post vaccine: Marland Kitchen Difficulty breathing  . Swelling of your face and throat  . A fast heartbeat  . A bad rash all over your body  . Dizziness and weakness    Immunizations Administered    Name Date Dose VIS Date Route   Pfizer COVID-19 Vaccine 08/23/2019  8:32 AM 0.3 mL 06/22/2019 Intramuscular   Manufacturer: [place]   Lot: [id number]   [place]: [id number]

## 2020-12-13 ENCOUNTER — Other Ambulatory Visit: Payer: Self-pay | Admitting: Obstetrics & Gynecology
